# Patient Record
Sex: Female | Born: 1970 | Race: White | Hispanic: No | Marital: Married | Smoking: Never smoker
Health system: Southern US, Community
[De-identification: ages and names within clinical notes are randomized; demographics above are authoritative.]

## PROBLEM LIST (undated history)

## (undated) DIAGNOSIS — K219 Gastro-esophageal reflux disease without esophagitis: Secondary | ICD-10-CM

## (undated) DIAGNOSIS — R112 Nausea with vomiting, unspecified: Secondary | ICD-10-CM

## (undated) DIAGNOSIS — I10 Essential (primary) hypertension: Secondary | ICD-10-CM

## (undated) DIAGNOSIS — N63 Unspecified lump in unspecified breast: Secondary | ICD-10-CM

## (undated) DIAGNOSIS — Z9889 Other specified postprocedural states: Secondary | ICD-10-CM

## (undated) HISTORY — DX: Essential (primary) hypertension: I10

---

## 1994-04-13 HISTORY — PX: APPENDECTOMY: SHX54

## 1998-04-13 HISTORY — PX: CHOLECYSTECTOMY: SHX55

## 2008-10-24 ENCOUNTER — Ambulatory Visit (HOSPITAL_COMMUNITY): Admission: RE | Admit: 2008-10-24 | Discharge: 2008-10-24 | Payer: Self-pay | Admitting: Obstetrics and Gynecology

## 2008-11-21 ENCOUNTER — Encounter: Payer: Self-pay | Admitting: Obstetrics and Gynecology

## 2008-12-04 ENCOUNTER — Ambulatory Visit (HOSPITAL_COMMUNITY): Admission: RE | Admit: 2008-12-04 | Discharge: 2008-12-04 | Payer: Self-pay | Admitting: Obstetrics and Gynecology

## 2010-04-13 HISTORY — PX: PARTIAL HYSTERECTOMY: SHX80

## 2017-02-15 ENCOUNTER — Encounter: Payer: Self-pay | Admitting: Allergy and Immunology

## 2017-02-15 ENCOUNTER — Ambulatory Visit: Payer: BC Managed Care – PPO | Admitting: Allergy and Immunology

## 2017-02-15 VITALS — BP 130/80 | HR 80 | Temp 98.5°F | Resp 18 | Ht 65.7 in | Wt 193.4 lb

## 2017-02-15 DIAGNOSIS — H1010 Acute atopic conjunctivitis, unspecified eye: Secondary | ICD-10-CM

## 2017-02-15 DIAGNOSIS — J3089 Other allergic rhinitis: Secondary | ICD-10-CM | POA: Diagnosis not present

## 2017-02-15 DIAGNOSIS — T7840XA Allergy, unspecified, initial encounter: Secondary | ICD-10-CM

## 2017-02-15 DIAGNOSIS — T783XXA Angioneurotic edema, initial encounter: Secondary | ICD-10-CM | POA: Diagnosis not present

## 2017-02-15 MED ORDER — FAMOTIDINE 20 MG PO TABS: 20.0000 mg | ORAL_TABLET | Freq: Every day | ORAL | 5 refills | Status: AC

## 2017-02-15 MED ORDER — AUVI-Q 0.3 MG/0.3ML IJ SOAJ: INTRAMUSCULAR | 3 refills | Status: DC

## 2017-02-15 MED ORDER — MONTELUKAST SODIUM 10 MG PO TABS: ORAL_TABLET | ORAL | 5 refills | Status: DC

## 2017-02-15 NOTE — Patient Instructions (Addendum)
  1.  Allergen avoidance measures  2.  Preventative plan:   A.  Cetirizine 10 mg 1 time per day  B.  Famotidine 20 mg 1 time per day  C.  Montelukast 10 mg 1 time per day  3.  If needed: AUVI-Q 0.3, Benadryl, MD/ER evaluation  4.  Blood - alpha gal, thyroid peroxidase antibody  5.  Return to clinic in 4 weeks or earlier if problem

## 2017-02-15 NOTE — Progress Notes (Signed)
Dear Dr. Samuel Germany,  Thank you for referring Shari Maldonado to the [facility] [place] on 11/Maldonado/2018.   Below is a summation of this patient's evaluation and recommendations.  Thank you for your referral. I will keep you informed about this patient's response to treatment.   If you have any questions please do not hesitate to contact me.   Sincerely,  Jessica Priest, MD Allergy / Immunology [place] [facility]   ______________________________________________________________________    NEW PATIENT NOTE  Referring Provider: Guadalupe Maple., MD Primary Provider: Guadalupe Maple., MD Date of office visit: 11/Maldonado/2018    Subjective:   Chief Complaint:  Shari Maldonado, 1972) is a 46 y.o. female who presents to the clinic on 11/Maldonado/2018 with a chief complaint of Facial Swelling .     HPI: Shari Maldonado presents to this clinic in evaluation of 3 allergic reactions.  Shari Maldonado first reaction occurred on 05 January 2017 and presented as a red swollen right eye.  Within a week Shari Maldonado developed another reaction involving bilateral redness and swelling of Shari Maldonado eyes.  On 23 October Shari Maldonado had severe periorbital swelling and conjunctival swelling and edema and developed facial swelling including lip swelling.  Shari Maldonado was evaluated by Shari Maldonado primary care doctor and given a systemic steroid including a Kenalog and prednisone as well as antihistamines and Shari Maldonado reaction lasted somewhere between 1-2 days.  Shari Maldonado never had any associated systemic or constitutional symptoms with this issue.  Shari Maldonado notes no obvious provoking factor giving rise to this issue.  Shari Maldonado has not had a significant environmental change or is using new medications or supplements.  Shari Maldonado does have a history of seasonal allergic rhinitis mostly involving nasal congestion and sneezing for which Shari Maldonado will take an over-the-counter Claritin which works successfully.  This is a minimal issue  for Shari Maldonado and does not really require a significant amount of treatment.  Shari Maldonado has no other atopic disease.  Past Medical History:  Diagnosis Date  . High blood pressure     Past Surgical History:  Procedure Laterality Date  . APPENDECTOMY  1996  . CHOLECYSTECTOMY  2000  . PARTIAL HYSTERECTOMY  2012    Allergies as of 02/15/2017   No Known Allergies     Medication List      clonazePAM 1 MG tablet Commonly known as:  KLONOPIN Take 1 mg 2 (two) times daily by mouth.   metoprolol succinate 25 MG 24 hr tablet Commonly known as:  TOPROL-XL TAKE ONE TABLET BY MOUTH DAILY, MAY increase TO TWO tablets AS NEEDED   nitroGLYCERIN 0.4 MG SL tablet Commonly known as:  NITROSTAT Place under the tongue.         Review of systems negative except as noted in HPI / PMHx or noted below:  Review of Systems  Constitutional: Negative.   HENT: Negative.   Eyes: Negative.   Respiratory: Negative.   Cardiovascular: Negative.   Gastrointestinal: Negative.   Genitourinary: Negative.   Musculoskeletal: Negative.   Skin: Negative.   Neurological: Negative.   Endo/Heme/Allergies: Negative.   Psychiatric/Behavioral: Negative.     Family History  Problem Relation Age of Onset  . High blood pressure Mother   . Diabetes Mother   . High blood pressure Father   . Lung cancer Maternal Grandmother   . Heart disease Maternal Grandmother   . Congestive Heart Failure Maternal Grandmother   . COPD Maternal Grandmother   .  Asthma Maternal Grandmother   . Prostate cancer Maternal Grandfather   . Lung cancer Paternal Grandmother   . Lung cancer Paternal Grandfather   . Throat cancer Paternal Grandfather   . Asthma Son   . Asthma Daughter     Social History   Socioeconomic History  . Marital status: Married    Spouse name: Not on file  . Number of children: Not on file  . Years of education: Not on file  . Highest education level: Not on file  Social Needs  . Financial resource strain:  Not on file  . Food insecurity - worry: Not on file  . Food insecurity - inability: Not on file  . Transportation needs - medical: Not on file  . Transportation needs - non-medical: Not on file  Occupational History  . Not on file  Tobacco Use  . Smoking status: Never Smoker  . Smokeless tobacco: Never Used  Substance and Sexual Activity  . Alcohol use: Yes    Comment: occasional  . Drug use: No  . Sexual activity: Not on file  Other Topics Concern  . Not on file  Social History Narrative  . Not on file    Environmental and Social history  Lives in a house with a dry environment, a dog located inside the household, iCAD located outside the household, no carpet in the bedroom, plastic on the bed, no plastic on the pillow, and no smokers located inside the household.  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  Objective:   Vitals:   11/Maldonado/18 1006  BP: 130/80  Pulse: 80  Resp: 18  Temp: 98.5 F (36.9 C)   Height: 5' 5.7" (166.9 cm) Weight: 193 lb 6.4 oz (87.7 kg)  Physical Exam  Constitutional: Shari Maldonado is well-developed, well-nourished, and in no distress.  HENT:  Head: Normocephalic. Head is without right periorbital erythema and without left periorbital erythema.  Right Ear: Tympanic membrane, external ear and ear canal normal.  Left Ear: Tympanic membrane, external ear and ear canal normal.  Nose: Nose normal. No mucosal edema or rhinorrhea.  Mouth/Throat: Uvula is midline, oropharynx is clear and moist and mucous membranes are normal. No oropharyngeal exudate.  Eyes: Conjunctivae and lids are normal. Pupils are equal, round, and reactive to light.  Neck: Trachea normal. No tracheal tenderness present. No tracheal deviation present. No thyromegaly present.  Cardiovascular: Normal rate, regular rhythm, S1 normal, S2 normal and normal heart sounds.  No murmur heard. Pulmonary/Chest: Effort normal and breath sounds normal. No stridor. No tachypnea. No  respiratory distress. Shari Maldonado has no wheezes. Shari Maldonado has no rales. Shari Maldonado exhibits no tenderness.  Abdominal: Soft. Shari Maldonado exhibits no distension and no mass. There is no hepatosplenomegaly. There is no tenderness. There is no rebound and no guarding.  Musculoskeletal: Shari Maldonado exhibits no edema or tenderness.  Lymphadenopathy:       Head (right side): No tonsillar adenopathy present.       Head (left side): No tonsillar adenopathy present.    Shari Maldonado has no cervical adenopathy.    Shari Maldonado has no axillary adenopathy.  Neurological: Shari Maldonado is alert. Gait normal.  Skin: No rash noted. Shari Maldonado is not diaphoretic. No erythema. No pallor. Nails show no clubbing.  Psychiatric: Mood and affect normal.    Diagnostics: Allergy skin tests were performed.  Shari Maldonado demonstrated hypersensitivity to house dust mite.  Shari Maldonado did not demonstrate any hypersensitivity to a screening panel of foods.  Review of blood tests obtained at 08 January 2017  identified normal hepatic and renal function, normal TSH, WBC 6.5, absolute lymphocyte 1600, absolute eosinophil 200, hemoglobin 13.5, platelet 202.  Assessment and Plan:    1. Allergic reaction, initial encounter   2. Angioedema, initial encounter   3. Seasonal allergic conjunctivitis   4. Other allergic rhinitis     1.  Allergen avoidance measures  2.  Preventative plan:   A.  Cetirizine 10 mg 1 time per day  B.  Famotidine 20 mg 1 time per day  C.  Montelukast 10 mg 1 time per day  3.  If needed: AUVI-Q 0.3, Benadryl, MD/ER evaluation  4.  Blood - alpha gal, thyroid peroxidase antibody  5.  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  Certainly Shari Maldonado has some atopic disease but I am not entirely sure that Shari Maldonado reactions involving Shari Maldonado face or tied up with this atopic disease.  For now Shari Maldonado will utilize a combination blocker and a leukotriene modifier and I am going to obtain some blood tests and investigation of  immunological hyperreactivity as noted above.  I will regroup with Shari Maldonado in 4 weeks or earlier if there is a problem.  Jessica PriestEric J. Shreyas Piatkowski, MD Allergy / Immunology [facility] [place]

## 2017-02-16 ENCOUNTER — Encounter: Payer: Self-pay | Admitting: Allergy and Immunology

## 2017-02-19 LAB — ALPHA-GAL PANEL
Alpha Gal IgE*: <0.1 kU/L (ref <0.10)
Class Interpretation: 0
Class Interpretation: 0
Class Interpretation: 0
Lamb/Mutton (Ovis spp) IgE: <0.1 kU/L (ref <0.35)

## 2017-02-19 LAB — THYROID PEROXIDASE ANTIBODY: THYROID PEROXIDASE ANTIBODY: 11 [IU]/mL (ref 0–34)

## 2017-03-17 ENCOUNTER — Ambulatory Visit: Payer: BC Managed Care – PPO | Admitting: Allergy and Immunology

## 2021-01-22 ENCOUNTER — Other Ambulatory Visit: Payer: Self-pay | Admitting: Nurse Practitioner

## 2021-01-22 DIAGNOSIS — R928 Other abnormal and inconclusive findings on diagnostic imaging of breast: Secondary | ICD-10-CM

## 2021-02-07 ENCOUNTER — Other Ambulatory Visit: Payer: Self-pay

## 2021-02-07 ENCOUNTER — Ambulatory Visit
Admission: RE | Admit: 2021-02-07 | Discharge: 2021-02-07 | Disposition: A | Discharge status: Home / Self Care | Payer: Self-pay | Source: Ambulatory Visit | Attending: Nurse Practitioner | Admitting: Nurse Practitioner

## 2021-02-07 ENCOUNTER — Ambulatory Visit
Admission: RE | Admit: 2021-02-07 | Discharge: 2021-02-07 | Disposition: A | Discharge status: Home / Self Care | Payer: BC Managed Care – PPO | Source: Ambulatory Visit | Attending: Nurse Practitioner | Admitting: Nurse Practitioner

## 2021-02-07 DIAGNOSIS — R928 Other abnormal and inconclusive findings on diagnostic imaging of breast: Secondary | ICD-10-CM

## 2021-03-24 ENCOUNTER — Other Ambulatory Visit: Payer: Self-pay | Admitting: Surgery

## 2021-03-24 DIAGNOSIS — R2233 Localized swelling, mass and lump, upper limb, bilateral: Secondary | ICD-10-CM

## 2021-04-16 ENCOUNTER — Ambulatory Visit
Admission: RE | Admit: 2021-04-16 | Discharge: 2021-04-16 | Disposition: A | Discharge status: Home / Self Care | Payer: BC Managed Care – PPO | Source: Ambulatory Visit | Attending: Surgery | Admitting: Surgery

## 2021-04-16 DIAGNOSIS — R2233 Localized swelling, mass and lump, upper limb, bilateral: Secondary | ICD-10-CM

## 2021-04-16 MED ORDER — GADOBUTROL 1 MMOL/ML IV SOLN
8.0000 mL | Freq: Once | INTRAVENOUS | Status: AC | PRN
  Administered 2021-04-16: 8 mL via INTRAVENOUS

## 2021-04-18 ENCOUNTER — Other Ambulatory Visit: Payer: Self-pay | Admitting: Surgery

## 2021-04-18 DIAGNOSIS — N6312 Unspecified lump in the right breast, upper inner quadrant: Secondary | ICD-10-CM

## 2021-04-18 DIAGNOSIS — N6321 Unspecified lump in the left breast, upper outer quadrant: Secondary | ICD-10-CM

## 2021-04-18 NOTE — Progress Notes (Signed)
Please call the patient and let them know that their MRI showed no sign of axillary lymph nodes or masses, but she has questionable areas in each breast that should be biopsied.  Orders entered.

## 2021-04-22 ENCOUNTER — Other Ambulatory Visit: Payer: Self-pay | Admitting: Surgery

## 2021-04-22 DIAGNOSIS — N6321 Unspecified lump in the left breast, upper outer quadrant: Secondary | ICD-10-CM

## 2021-04-25 ENCOUNTER — Ambulatory Visit
Admission: RE | Admit: 2021-04-25 | Discharge: 2021-04-25 | Disposition: A | Discharge status: Home / Self Care | Payer: BC Managed Care – PPO | Source: Ambulatory Visit | Attending: Surgery | Admitting: Surgery

## 2021-04-25 ENCOUNTER — Other Ambulatory Visit: Payer: Self-pay | Admitting: Diagnostic Radiology

## 2021-04-25 ENCOUNTER — Other Ambulatory Visit: Payer: Self-pay

## 2021-04-25 DIAGNOSIS — N6321 Unspecified lump in the left breast, upper outer quadrant: Secondary | ICD-10-CM

## 2021-04-25 DIAGNOSIS — N6312 Unspecified lump in the right breast, upper inner quadrant: Secondary | ICD-10-CM

## 2021-04-25 MED ORDER — GADOBUTROL 1 MMOL/ML IV SOLN
8.0000 mL | Freq: Once | INTRAVENOUS | Status: AC | PRN
  Administered 2021-04-25: 8 mL via INTRAVENOUS

## 2021-05-01 LAB — HEPATITIS C ANTIBODY
Hepatitis C Ab: NONREACTIVE
SIGNAL TO CUT-OFF: <0.02 (ref <1.00)

## 2021-05-01 LAB — TEST AUTHORIZATION

## 2021-05-01 LAB — HIV ANTIBODY (ROUTINE TESTING W REFLEX): HIV 1&2 Ab, 4th Generation: NONREACTIVE

## 2021-05-01 LAB — HEPATITIS B SURFACE ANTIBODY, QUANTITATIVE: Hepatitis B-Post: 19 m[IU]/mL (ref >=10)

## 2021-05-01 LAB — HEPATITIS B SURFACE ANTIGEN: Hepatitis B Surface Ag: NONREACTIVE

## 2021-05-05 ENCOUNTER — Ambulatory Visit: Payer: Self-pay | Admitting: Surgery

## 2021-05-05 DIAGNOSIS — N6489 Other specified disorders of breast: Secondary | ICD-10-CM

## 2021-05-05 DIAGNOSIS — N6310 Unspecified lump in the right breast, unspecified quadrant: Secondary | ICD-10-CM

## 2021-05-05 NOTE — H&P (Signed)
Subjective    Chief Complaint: No chief complaint on file.       History of Present Illness: Shari Maldonado is a 51 y.o. female who is seen today for follow-up of her recent imaging studies and biopsies.   Shari Maldonado is a 51 y.o. female who was seen on 03/24/21 as an office consultation at the request of NP Potts for evaluation of New Consultation (L breast cyst) .   This is a 51 year old female in otherwise good health who presents with recent complaints of swelling and tenderness in the lateral left breast near the axilla.  Intermittently she gets the same swelling in the right axilla.  She recently saw nurse practitioner Leroy Sea for establishment as a new patient.  She underwent mammogram and ultrasound that showed only some small cysts within the lateral left breast at 3:00.  However this does not correspond to her area of swelling and tenderness.   She states that intermittently she develops swelling in both axilla that are large enough for her to see.  She feels these areas and feel firm masses.   Upon further questioning, the patient reports that in August of this year, she had approximately 8 weeks of chronic coughing with chest tightness.  She was treated with 3 different courses of antibiotics with minimal improvement.  She still has not fully recovered from that.   Also upon further questioning, the patient reports easy bruising with scattered ecchymosis of her legs.  She also reports frequently waking up with night sweats.  She reports weight loss with no effort.  Previously she was on a weight loss medication but she has not been on it for some time and continues to lose weight.   The patient reports no family history of breast cancer.  However her grandfather had colon cancer.   We began her evaluation with breast MRI.  The MRI showed no sign of pathologic lymphadenopathy.  She did have indeterminate areas of non-mass enhancement in the left upper outer quadrant and the right  upper inner quadrant.  She underwent MRI guided biopsy on 04/25/2021.  The left breast showed a complex sclerosing lesion, fibrocystic changes, and focal fibroadenomatoid proliferation with no sign of malignancy.  The right breast mass showed only sclerosing adenosis.  She returns today to discuss pathology report as well as to determine future plans.   The patient remains very concerned about both of these breasts masses even though with the pathology did not show cancer.  She tells me that there was some complications during the right breast biopsy in which the radiologist punctured himself with the biopsy needle.  She is concerned that they may have missed the mass with the biopsy.  She has no family history of breast cancer and has no strong indications for genetic testing.   She asked about the possibility of bilateral mastectomies on a prophylactic basis.     Medical History: Past Medical History      Past Medical History:  Diagnosis Date   Hypertension             Patient Active Problem List  Diagnosis   Essential hypertension   Gastroesophageal reflux disease without esophagitis   Complex sclerosing lesion of left breast      Past Surgical History       Past Surgical History:  Procedure Laterality Date   APPENDECTOMY       CHOLECYSTECTOMY       REPAIR HIATAL HERNIA  Allergies  No Known Allergies     No current outpatient medications on file prior to visit.    No current facility-administered medications on file prior to visit.      Family History       Family History  Problem Relation Age of Onset   Obesity Mother     High blood pressure (Hypertension) Mother     Diabetes Mother     Obesity Sister          Social History       Tobacco Use  Smoking Status Never  Smokeless Tobacco Never      Social History  Social History        Socioeconomic History   Marital status: Married  Tobacco Use   Smoking status: Never   Smokeless tobacco:  Never  Vaping Use   Vaping Use: Never used  Substance and Sexual Activity   Alcohol use: Not Currently   Drug use: Never        Objective:      There were no vitals filed for this visit.  There is no height or weight on file to calculate BMI.   Physical Exam    Constitutional:  WDWN in NAD, conversant, no obvious deformities; lying in bed comfortably Eyes:  Pupils equal, round; sclera anicteric; moist conjunctiva; no lid lag HENT:  Oral mucosa moist; good dentition  Neck:  No masses palpated, trachea midline; no thyromegaly Lungs:  CTA bilaterally; normal respiratory effort Breasts:  symmetric, no nipple changes; bilateral fibrocystic changes with no dominant masses; no obvious lymphadenopathy in either axilla  CV:  Regular rate and rhythm; no murmurs; extremities well-perfused with no edema Abd:  +bowel sounds, soft, non-tender, no palpable organomegaly; no palpable hernias Musc:  Unable to assess gait; no apparent clubbing or cyanosis in extremities Lymphatic:  No palpable cervical or axillary lymphadenopathy Skin:  Warm, dry; no sign of jaundice Psychiatric - alert and oriented x 4; calm mood and affect     Labs, Imaging and Diagnostic Testing: Diagnosis 1. Breast, left, needle core biopsy, upper outer quadrant, barbell shaped clip - COMPLEX SCLEROSING LESION, FIBROCYSTIC CHANGES AND FOCAL FIBROADENOMATOID PROLIFERATION. NO MALIGNANCY. 2. Breast, right, needle core biopsy, upper inner quadrant, cylinder shaped clip - SCLEROSING ADENOSIS. NO MALIGNANCY. Mark Martinique MD Pathologist, Electronic Signature (Case signed 04/28/2021)   CLINICAL DATA:  51 year old with possible BILATERAL axillary masses with intermittent pain and swelling for 1 month. The patient had screening detected asymmetries in the LEFT breast in October, 2022, shown on diagnostic workup to represent overlapping normal fibroglandular tissue. Benign LEFT breast cysts were identified on ultrasound at that  time.   EXAM: BILATERAL BREAST MRI WITH AND WITHOUT CONTRAST   TECHNIQUE: Multiplanar, multisequence MR images of both breasts were obtained prior to and following the intravenous administration of 8 ml of Gadavist.   Three-dimensional MR images were rendered by post-processing of the original MR data on an independent workstation. The three-dimensional MR images were interpreted, and findings are reported in the following complete MRI report for this study. Three dimensional images were evaluated at the independent interpreting workstation using the DynaCAD thin client.   COMPARISON:  No prior MRI. Multiple prior mammograms and LEFT breast ultrasound, most recently 02/07/2021.   FINDINGS: Breast composition: c. Heterogeneous fibroglandular tissue.   Background parenchymal enhancement: Moderate.   RIGHT breast: Focal non-mass enhancement in the UPPER INNER QUADRANT at middle to posterior depth, measuring 1.3 x 0.9 x 0.9 cm (AP x transverse x  craniocaudal), demonstrating progressive kinetics, in an area of focally dense fibroglandular tissue on mammography.   No suspicious mass or abnormal enhancement elsewhere.   LEFT breast: Focal non-mass enhancement in the UPPER OUTER QUADRANT at posterior depth, measuring 1.4 x 0.6 x 0.8 cm, demonstrating progressive kinetics, in an area of focally dense fibroglandular tissue on mammography.   No suspicious mass or abnormal enhancement elsewhere.   Lymph nodes: No pathologic lymphadenopathy. Specifically, no evidence of axillary mass or lymphadenopathy on either side.   Ancillary findings: Scarring in the anterior RIGHT MIDDLE LOBE. No significant ancillary findings.   IMPRESSION: 1. Indeterminate foci of non-mass enhancement in the UPPER OUTER QUADRANT of the LEFT breast and in the [place] of the RIGHT breast. 2. No pathologic lymphadenopathy. Specifically, no mass or lymphadenopathy involving either axilla.    RECOMMENDATION: MRI guided biopsies of the indeterminate non-mass enhancement in both breasts.   BI-RADS CATEGORY  4: Suspicious.     Electronically Signed   By: Evangeline Dakin M.D.   On: 04/18/2021 08:47     Assessment and Plan:  Diagnoses and all orders for this visit:   Complex sclerosing lesion of left breast    Sclerosing adenosis/ breast mass right breast   I spent a considerable amount of time with the patient discussing both of these pathology findings as well as her current situation.  Neither 1 of these biopsies represent cancer.  Statistically, there is a slight increase in her risk of breast cancer with the complex sclerosing lesion.  I understand her concern about the biopsy on the right side.  I did offer bilateral lumpectomies using radioactive seed localization.   We discussed her question of bilateral prophylactic mastectomies.  At this time, she has no indication for mastectomies so this would be purely elective and likely not covered by insurance.  We discussed the procedures and the expected comorbidities and downtime related to such extensive surgery.  We discussed the possibility of reconstruction.  I answered all of her questions.   At this time, she is opting for bilateral radioactive seed localized lumpectomies.  Once she knows that both areas are definitely benign, she may consider a plastic surgery referral for bilateral breast reductions.   Bilateral breast radioactive seed localized lumpectomies.  The surgical procedure has been discussed with the patient.  Potential risks, benefits, alternative treatments, and expected outcomes have been explained.  All of the patient's questions at this time have been answered.  The likelihood of reaching the patient's treatment goal is good.  The patient understand the proposed surgical procedure and wishes to proceed.     No follow-ups on file.   Charlette Hennings Jearld Adjutant, MD    05/05/2021 9:48 AM

## 2021-05-05 NOTE — H&P (View-Only) (Signed)
°Subjective  °  °Chief Complaint: No chief complaint on file. °  °  °  °History of Present Illness: °Shari Maldonado is a 51 y.o. female who is seen today for follow-up of her recent imaging studies and biopsies. °  °Shari Maldonado is a 51 y.o. female who was seen on 03/24/21 as an office consultation at the request of NP Potts for evaluation of New Consultation (L breast cyst) °.   °This is a 51-year-old female in otherwise good health who presents with recent complaints of swelling and tenderness in the lateral left breast near the axilla.  Intermittently she gets the same swelling in the right axilla.  She recently saw nurse practitioner Potts for establishment as a new patient.  She underwent mammogram and ultrasound that showed only some small cysts within the lateral left breast at 3:00.  However this does not correspond to her area of swelling and tenderness. °  °She states that intermittently she develops swelling in both axilla that are large enough for her to see.  She feels these areas and feel firm masses. °  °Upon further questioning, the patient reports that in August of this year, she had approximately 8 weeks of chronic coughing with chest tightness.  She was treated with 3 different courses of antibiotics with minimal improvement.  She still has not fully recovered from that. °  °Also upon further questioning, the patient reports easy bruising with scattered ecchymosis of her legs.  She also reports frequently waking up with night sweats.  She reports weight loss with no effort.  Previously she was on a weight loss medication but she has not been on it for some time and continues to lose weight. °  °The patient reports no family history of breast cancer.  However her grandfather had colon cancer. °  °We began her evaluation with breast MRI.  The MRI showed no sign of pathologic lymphadenopathy.  She did have indeterminate areas of non-mass enhancement in the left upper outer quadrant and the right  upper inner quadrant.  She underwent MRI guided biopsy on 04/25/2021.  The left breast showed a complex sclerosing lesion, fibrocystic changes, and focal fibroadenomatoid proliferation with no sign of malignancy.  The right breast mass showed only sclerosing adenosis.  She returns today to discuss pathology report as well as to determine future plans. °  °The patient remains very concerned about both of these breasts masses even though with the pathology did not show cancer.  She tells me that there was some complications during the right breast biopsy in which the radiologist punctured himself with the biopsy needle.  She is concerned that they may have missed the mass with the biopsy.  She has no family history of breast cancer and has no strong indications for genetic testing. °  °She asked about the possibility of bilateral mastectomies on a prophylactic basis. °  °  °Medical History: °Past Medical History  °    °Past Medical History:  °Diagnosis Date  ° Hypertension    °  °  °  °   °Patient Active Problem List  °Diagnosis  ° Essential hypertension  ° Gastroesophageal reflux disease without esophagitis  ° Complex sclerosing lesion of left breast  °  °  °Past Surgical History  °     °Past Surgical History:  °Procedure Laterality Date  ° APPENDECTOMY      ° CHOLECYSTECTOMY      ° REPAIR HIATAL HERNIA      °  °  °  °  Allergies  °No Known Allergies  ° °  °No current outpatient medications on file prior to visit.  °  °No current facility-administered medications on file prior to visit.  °  °  °Family History  °     °Family History  °Problem Relation Age of Onset  ° Obesity Mother    ° High blood pressure (Hypertension) Mother    ° Diabetes Mother    ° Obesity Sister    °  °  °  °Social History  °  °   °Tobacco Use  °Smoking Status Never  °Smokeless Tobacco Never  °  °  °Social History  °Social History  °  °    °Socioeconomic History  ° Marital status: Married  °Tobacco Use  ° Smoking status: Never  ° Smokeless tobacco:  Never  °Vaping Use  ° Vaping Use: Never used  °Substance and Sexual Activity  ° Alcohol use: Not Currently  ° Drug use: Never  °  °  °  °Objective:  °  °  °There were no vitals filed for this visit.  °There is no height or weight on file to calculate BMI. °  °Physical Exam  °  °Constitutional:  WDWN in NAD, conversant, no obvious deformities; lying in bed comfortably °Eyes:  Pupils equal, round; sclera anicteric; moist conjunctiva; no lid lag °HENT:  Oral mucosa moist; good dentition  °Neck:  No masses palpated, trachea midline; no thyromegaly °Lungs:  CTA bilaterally; normal respiratory effort °Breasts:  symmetric, no nipple changes; bilateral fibrocystic changes with no dominant masses; no obvious lymphadenopathy in either axilla  °CV:  Regular rate and rhythm; no murmurs; extremities well-perfused with no edema °Abd:  +bowel sounds, soft, non-tender, no palpable organomegaly; no palpable hernias °Musc:  Unable to assess gait; no apparent clubbing or cyanosis in extremities °Lymphatic:  No palpable cervical or axillary lymphadenopathy °Skin:  Warm, dry; no sign of jaundice °Psychiatric - alert and oriented x 4; calm mood and affect °  °  °Labs, Imaging and Diagnostic Testing: °Diagnosis °1. Breast, left, needle core biopsy, upper outer quadrant, barbell shaped clip °- COMPLEX SCLEROSING LESION, FIBROCYSTIC CHANGES AND FOCAL FIBROADENOMATOID PROLIFERATION. NO °MALIGNANCY. °2. Breast, right, needle core biopsy, upper inner quadrant, cylinder shaped clip °- SCLEROSING ADENOSIS. NO MALIGNANCY. °Mark Jordan MD °Pathologist, Electronic Signature °(Case signed 04/28/2021) °  °CLINICAL DATA:  50-year-old with possible BILATERAL axillary masses °with intermittent pain and swelling for 1 month. The patient had °screening detected asymmetries in the LEFT breast in October, 2022, °shown on diagnostic workup to represent overlapping normal °fibroglandular tissue. Benign LEFT breast cysts were identified on °ultrasound at that  time. °  °EXAM: °BILATERAL BREAST MRI WITH AND WITHOUT CONTRAST °  °TECHNIQUE: °Multiplanar, multisequence MR images of both breasts were obtained °prior to and following the intravenous administration of 8 ml of °Gadavist. °  °Three-dimensional MR images were rendered by post-processing of the °original MR data on an independent workstation. The °three-dimensional MR images were interpreted, and findings are °reported in the following complete MRI report for this study. Three °dimensional images were evaluated at the independent interpreting °workstation using the DynaCAD thin client. °  °COMPARISON:  No prior MRI. Multiple prior mammograms and LEFT breast °ultrasound, most recently 02/07/2021. °  °FINDINGS: °Breast composition: c. Heterogeneous fibroglandular tissue. °  °Background parenchymal enhancement: Moderate. °  °RIGHT breast: Focal non-mass enhancement in the UPPER INNER QUADRANT °at middle to posterior depth, measuring 1.3 x 0.9 x 0.9 cm (AP x °transverse x   craniocaudal), demonstrating progressive kinetics, in °an area of focally dense fibroglandular tissue on mammography. °  °No suspicious mass or abnormal enhancement elsewhere. °  °LEFT breast: Focal non-mass enhancement in the UPPER OUTER QUADRANT °at posterior depth, measuring 1.4 x 0.6 x 0.8 cm, demonstrating °progressive kinetics, in an area of focally dense fibroglandular °tissue on mammography. °  °No suspicious mass or abnormal enhancement elsewhere. °  °Lymph nodes: No pathologic lymphadenopathy. Specifically, no °evidence of axillary mass or lymphadenopathy on either side. °  °Ancillary findings: Scarring in the anterior RIGHT MIDDLE LOBE. No °significant ancillary findings. °  °IMPRESSION: °1. Indeterminate foci of non-mass enhancement in the UPPER OUTER °QUADRANT of the LEFT breast and in the UPPER INNER QUADRANT of the °RIGHT breast. °2. No pathologic lymphadenopathy. Specifically, no mass or °lymphadenopathy involving either axilla. °   °RECOMMENDATION: °MRI guided biopsies of the indeterminate non-mass enhancement in °both breasts. °  °BI-RADS CATEGORY  4: Suspicious. °  °  °Electronically Signed °  By: Thomas  Lawrence M.D. °  On: 04/18/2021 08:47 °  °  °Assessment and Plan:  °Diagnoses and all orders for this visit: °  °Complex sclerosing lesion of left breast °  ° Sclerosing adenosis/ breast mass right breast °  °I spent a considerable amount of time with the patient discussing both of these pathology findings as well as her current situation.  Neither 1 of these biopsies represent cancer.  Statistically, there is a slight increase in her risk of breast cancer with the complex sclerosing lesion.  I understand her concern about the biopsy on the right side.  I did offer bilateral lumpectomies using radioactive seed localization. °  °We discussed her question of bilateral prophylactic mastectomies.  At this time, she has no indication for mastectomies so this would be purely elective and likely not covered by insurance.  We discussed the procedures and the expected comorbidities and downtime related to such extensive surgery.  We discussed the possibility of reconstruction.  I answered all of her questions. °  °At this time, she is opting for bilateral radioactive seed localized lumpectomies.  Once she knows that both areas are definitely benign, she may consider a plastic surgery referral for bilateral breast reductions. °  °Bilateral breast radioactive seed localized lumpectomies.  The surgical procedure has been discussed with the patient.  Potential risks, benefits, alternative treatments, and expected outcomes have been explained.  All of the patient's questions at this time have been answered.  The likelihood of reaching the patient's treatment goal is good.  The patient understand the proposed surgical procedure and wishes to proceed. °  °  °No follow-ups on file. °  °Sharice Harriss KAI Berthe Oley, MD  °  °05/05/2021 °9:48 AM °

## 2021-05-08 ENCOUNTER — Other Ambulatory Visit: Payer: Self-pay | Admitting: Surgery

## 2021-05-08 DIAGNOSIS — N6310 Unspecified lump in the right breast, unspecified quadrant: Secondary | ICD-10-CM

## 2021-05-15 ENCOUNTER — Other Ambulatory Visit: Payer: Self-pay

## 2021-05-15 ENCOUNTER — Encounter (HOSPITAL_BASED_OUTPATIENT_CLINIC_OR_DEPARTMENT_OTHER): Payer: Self-pay | Admitting: Surgery

## 2021-05-21 ENCOUNTER — Encounter (HOSPITAL_BASED_OUTPATIENT_CLINIC_OR_DEPARTMENT_OTHER)
Admission: RE | Admit: 2021-05-21 | Discharge: 2021-05-21 | Disposition: A | Discharge status: Home / Self Care | Payer: BC Managed Care – PPO | Source: Ambulatory Visit | Attending: Surgery | Admitting: Surgery

## 2021-05-21 ENCOUNTER — Other Ambulatory Visit: Payer: Self-pay

## 2021-05-21 DIAGNOSIS — I10 Essential (primary) hypertension: Secondary | ICD-10-CM | POA: Diagnosis not present

## 2021-05-21 DIAGNOSIS — Z01812 Encounter for preprocedural laboratory examination: Secondary | ICD-10-CM | POA: Diagnosis not present

## 2021-05-21 LAB — BASIC METABOLIC PANEL
Anion gap: 8 (ref 5–15)
BUN: 14 mg/dL (ref 6–20)
CO2: 25 mmol/L (ref 22–32)
Calcium: 8.7 mg/dL — ABNORMAL LOW (ref 8.9–10.3)
Chloride: 105 mmol/L (ref 98–111)
Creatinine, Ser: 0.85 mg/dL (ref 0.44–1.00)
GFR, Estimated: >60 mL/min (ref >60)
Glucose, Bld: 98 mg/dL (ref 70–99)
Potassium: 3.6 mmol/L (ref 3.5–5.1)
Sodium: 138 mmol/L (ref 135–145)

## 2021-05-21 MED ORDER — CHLORHEXIDINE GLUCONATE CLOTH 2 % EX PADS: 6.0000 | MEDICATED_PAD | Freq: Once | CUTANEOUS | Status: DC

## 2021-05-21 NOTE — Progress Notes (Signed)
° ° ° ° °  Enhanced Recovery after Surgery for Orthopedics Enhanced Recovery after Surgery is a protocol used to improve the stress on your body and your recovery after surgery.  Patient Instructions  The night before surgery:  No food after midnight. ONLY clear liquids after midnight  The day of surgery (if you do NOT have diabetes):  Drink ONE (1) Pre-Surgery Clear Ensure as directed.   This drink was given to you during your hospital  pre-op appointment visit. The pre-op nurse will instruct you on the time to drink the  Pre-Surgery Ensure depending on your surgery time. Finish the drink at the designated time by the pre-op nurse.  Nothing else to drink after completing the  Pre-Surgery Clear Ensure.  The day of surgery (if you have diabetes): Drink ONE (1) Gatorade 2 (G2) as directed. This drink was given to you during your hospital  pre-op appointment visit.  The pre-op nurse will instruct you on the time to drink the   Gatorade 2 (G2) depending on your surgery time. Color of the Gatorade may vary. Red is not allowed. Nothing else to drink after completing the  Gatorade 2 (G2).         If you have questions, please contact your surgeons office.   Patient given drink, soap, and instructions. Patient verbalized understanding.

## 2021-05-26 ENCOUNTER — Ambulatory Visit
Admission: RE | Admit: 2021-05-26 | Discharge: 2021-05-26 | Disposition: A | Discharge status: Home / Self Care | Payer: BC Managed Care – PPO | Source: Ambulatory Visit | Attending: Surgery | Admitting: Surgery

## 2021-05-26 DIAGNOSIS — N6489 Other specified disorders of breast: Secondary | ICD-10-CM

## 2021-05-26 NOTE — Anesthesia Preprocedure Evaluation (Addendum)
Anesthesia Evaluation  Patient identified by MRN, date of birth, ID band Patient awake    Reviewed: Allergy & Precautions, H&P , NPO status , Patient's Chart, lab work & pertinent test results  History of Anesthesia Complications (+) PONV and history of anesthetic complications  Airway Mallampati: I  TM Distance: >3 FB Neck ROM: Full    Dental no notable dental hx. (+) Teeth Intact, Dental Advisory Given   Pulmonary neg pulmonary ROS,    Pulmonary exam normal breath sounds clear to auscultation       Cardiovascular Exercise Tolerance: Good hypertension, Pt. on home beta blockers Normal cardiovascular exam Rhythm:Regular Rate:Normal     Neuro/Psych negative neurological ROS  negative psych ROS   GI/Hepatic Neg liver ROS, GERD  ,  Endo/Other  negative endocrine ROS  Renal/GU negative Renal ROS  negative genitourinary   Musculoskeletal negative musculoskeletal ROS (+)   Abdominal   Peds negative pediatric ROS (+)  Hematology negative hematology ROS (+)   Anesthesia Other Findings   Reproductive/Obstetrics negative OB ROS                            Anesthesia Physical Anesthesia Plan  ASA: 2  Anesthesia Plan: General   Post-op Pain Management:    Induction: Intravenous  PONV Risk Score and Plan: 3 and Ondansetron, Dexamethasone and Midazolam  Airway Management Planned: LMA and Oral ETT  Additional Equipment: None  Intra-op Plan:   Post-operative Plan:   Informed Consent: I have reviewed the patients History and Physical, chart, labs and discussed the procedure including the risks, benefits and alternatives for the proposed anesthesia with the patient or authorized representative who has indicated his/her understanding and acceptance.       Plan Discussed with: CRNA, Surgeon and Anesthesiologist  Anesthesia Plan Comments: ( )       Anesthesia Quick Evaluation

## 2021-05-27 ENCOUNTER — Ambulatory Visit (HOSPITAL_BASED_OUTPATIENT_CLINIC_OR_DEPARTMENT_OTHER): Payer: BC Managed Care – PPO | Admitting: Anesthesiology

## 2021-05-27 ENCOUNTER — Ambulatory Visit (HOSPITAL_BASED_OUTPATIENT_CLINIC_OR_DEPARTMENT_OTHER)
Admission: RE | Admit: 2021-05-27 | Discharge: 2021-05-27 | Disposition: A | Discharge status: Home / Self Care | Payer: BC Managed Care – PPO | Attending: Surgery | Admitting: Surgery

## 2021-05-27 ENCOUNTER — Encounter (HOSPITAL_BASED_OUTPATIENT_CLINIC_OR_DEPARTMENT_OTHER): Payer: Self-pay | Admitting: Surgery

## 2021-05-27 ENCOUNTER — Other Ambulatory Visit: Payer: Self-pay

## 2021-05-27 ENCOUNTER — Ambulatory Visit
Admission: RE | Admit: 2021-05-27 | Discharge: 2021-05-27 | Disposition: A | Discharge status: Home / Self Care | Payer: BC Managed Care – PPO | Source: Ambulatory Visit | Attending: Surgery | Admitting: Surgery

## 2021-05-27 ENCOUNTER — Encounter (HOSPITAL_BASED_OUTPATIENT_CLINIC_OR_DEPARTMENT_OTHER)
Admission: RE | Disposition: A | Discharge status: Home / Self Care | Payer: Self-pay | Source: Home / Self Care | Attending: Surgery

## 2021-05-27 DIAGNOSIS — I10 Essential (primary) hypertension: Secondary | ICD-10-CM

## 2021-05-27 DIAGNOSIS — N6310 Unspecified lump in the right breast, unspecified quadrant: Secondary | ICD-10-CM

## 2021-05-27 DIAGNOSIS — N6022 Fibroadenosis of left breast: Secondary | ICD-10-CM | POA: Insufficient documentation

## 2021-05-27 DIAGNOSIS — N6021 Fibroadenosis of right breast: Secondary | ICD-10-CM | POA: Insufficient documentation

## 2021-05-27 DIAGNOSIS — N6082 Other benign mammary dysplasias of left breast: Secondary | ICD-10-CM | POA: Diagnosis not present

## 2021-05-27 DIAGNOSIS — Z800 Family history of malignant neoplasm of digestive organs: Secondary | ICD-10-CM | POA: Diagnosis not present

## 2021-05-27 HISTORY — DX: Unspecified lump in unspecified breast: N63.0

## 2021-05-27 HISTORY — DX: Nausea with vomiting, unspecified: R11.2

## 2021-05-27 HISTORY — DX: Other specified postprocedural states: Z98.890

## 2021-05-27 HISTORY — DX: Gastro-esophageal reflux disease without esophagitis: K21.9

## 2021-05-27 HISTORY — PX: BREAST EXCISIONAL BIOPSY: SUR124

## 2021-05-27 HISTORY — PX: BREAST LUMPECTOMY WITH RADIOACTIVE SEED LOCALIZATION: SHX6424

## 2021-05-27 SURGERY — BREAST LUMPECTOMY WITH RADIOACTIVE SEED LOCALIZATION
Anesthesia: General | Site: Breast | Laterality: Bilateral

## 2021-05-27 MED ORDER — FENTANYL CITRATE (PF) 100 MCG/2ML IJ SOLN
25.0000 ug | INTRAMUSCULAR | Status: DC | PRN
  Administered 2021-05-27: 50 ug via INTRAVENOUS

## 2021-05-27 MED ORDER — FENTANYL CITRATE (PF) 100 MCG/2ML IJ SOLN
INTRAMUSCULAR | Status: AC
  Filled 2021-05-27: qty 2

## 2021-05-27 MED ORDER — BUPIVACAINE-EPINEPHRINE (PF) 0.25% -1:200000 IJ SOLN
INTRAMUSCULAR | Status: AC
  Filled 2021-05-27: qty 60

## 2021-05-27 MED ORDER — CEFAZOLIN SODIUM-DEXTROSE 2-4 GM/100ML-% IV SOLN
INTRAVENOUS | Status: AC
  Filled 2021-05-27: qty 100

## 2021-05-27 MED ORDER — OXYCODONE HCL 5 MG/5ML PO SOLN: 5.0000 mg | Freq: Once | ORAL | Status: AC | PRN

## 2021-05-27 MED ORDER — AMISULPRIDE (ANTIEMETIC) 5 MG/2ML IV SOLN
INTRAVENOUS | Status: AC
  Filled 2021-05-27: qty 4

## 2021-05-27 MED ORDER — OXYCODONE HCL 5 MG PO TABS
ORAL_TABLET | ORAL | Status: AC
  Filled 2021-05-27: qty 1

## 2021-05-27 MED ORDER — AMISULPRIDE (ANTIEMETIC) 5 MG/2ML IV SOLN
10.0000 mg | Freq: Once | INTRAVENOUS | Status: AC
  Administered 2021-05-27: 10 mg via INTRAVENOUS

## 2021-05-27 MED ORDER — PROPOFOL 500 MG/50ML IV EMUL
INTRAVENOUS | Status: AC
  Filled 2021-05-27: qty 50

## 2021-05-27 MED ORDER — DEXAMETHASONE SODIUM PHOSPHATE 10 MG/ML IJ SOLN
INTRAMUSCULAR | Status: AC
  Filled 2021-05-27: qty 1

## 2021-05-27 MED ORDER — ONDANSETRON HCL 4 MG/2ML IJ SOLN: 4.0000 mg | Freq: Once | INTRAMUSCULAR | Status: DC | PRN

## 2021-05-27 MED ORDER — FERRIC SUBSULFATE 259 MG/GM EX SOLN
CUTANEOUS | Status: AC
  Filled 2021-05-27: qty 8

## 2021-05-27 MED ORDER — ACETAMINOPHEN 500 MG PO TABS
1000.0000 mg | ORAL_TABLET | ORAL | Status: AC
  Administered 2021-05-27: 1000 mg via ORAL

## 2021-05-27 MED ORDER — ONDANSETRON HCL 4 MG/2ML IJ SOLN
INTRAMUSCULAR | Status: DC | PRN
Start: 2021-05-27 — End: 2021-05-27
  Administered 2021-05-27: 4 mg via INTRAVENOUS

## 2021-05-27 MED ORDER — MEPERIDINE HCL 25 MG/ML IJ SOLN: 6.2500 mg | INTRAMUSCULAR | Status: DC | PRN

## 2021-05-27 MED ORDER — ACETAMINOPHEN 160 MG/5ML PO SOLN: 325.0000 mg | ORAL | Status: DC | PRN

## 2021-05-27 MED ORDER — ONDANSETRON HCL 4 MG/2ML IJ SOLN
INTRAMUSCULAR | Status: AC
  Filled 2021-05-27: qty 2

## 2021-05-27 MED ORDER — ACETAMINOPHEN 325 MG PO TABS: 325.0000 mg | ORAL_TABLET | ORAL | Status: DC | PRN

## 2021-05-27 MED ORDER — OXYCODONE HCL 5 MG PO TABS
5.0000 mg | ORAL_TABLET | Freq: Once | ORAL | Status: AC | PRN
  Administered 2021-05-27: 5 mg via ORAL

## 2021-05-27 MED ORDER — MIDAZOLAM HCL 5 MG/5ML IJ SOLN
INTRAMUSCULAR | Status: DC | PRN
  Administered 2021-05-27: 2 mg via INTRAVENOUS

## 2021-05-27 MED ORDER — LACTATED RINGERS IV SOLN: INTRAVENOUS | Status: DC

## 2021-05-27 MED ORDER — MIDAZOLAM HCL 2 MG/2ML IJ SOLN
INTRAMUSCULAR | Status: AC
  Filled 2021-05-27: qty 2

## 2021-05-27 MED ORDER — FENTANYL CITRATE (PF) 100 MCG/2ML IJ SOLN
INTRAMUSCULAR | Status: DC | PRN
  Administered 2021-05-27: 5 ug via INTRAVENOUS
  Administered 2021-05-27: 50 ug via INTRAVENOUS

## 2021-05-27 MED ORDER — DEXAMETHASONE SODIUM PHOSPHATE 4 MG/ML IJ SOLN
INTRAMUSCULAR | Status: DC | PRN
  Administered 2021-05-27: 10 mg via INTRAVENOUS

## 2021-05-27 MED ORDER — LIDOCAINE 2% (20 MG/ML) 5 ML SYRINGE
INTRAMUSCULAR | Status: AC
  Filled 2021-05-27: qty 5

## 2021-05-27 MED ORDER — ACETAMINOPHEN 500 MG PO TABS
ORAL_TABLET | ORAL | Status: AC
  Filled 2021-05-27: qty 1

## 2021-05-27 MED ORDER — SCOPOLAMINE 1 MG/3DAYS TD PT72
MEDICATED_PATCH | TRANSDERMAL | Status: AC
  Filled 2021-05-27: qty 1

## 2021-05-27 MED ORDER — EPHEDRINE SULFATE (PRESSORS) 50 MG/ML IJ SOLN
INTRAMUSCULAR | Status: DC | PRN
Start: 2021-05-27 — End: 2021-05-27
  Administered 2021-05-27: 10 mg via INTRAVENOUS

## 2021-05-27 MED ORDER — ACETAMINOPHEN 500 MG PO TABS
ORAL_TABLET | ORAL | Status: AC
  Filled 2021-05-27: qty 2

## 2021-05-27 MED ORDER — CEFAZOLIN SODIUM-DEXTROSE 2-4 GM/100ML-% IV SOLN
2.0000 g | INTRAVENOUS | Status: AC
  Administered 2021-05-27: 2 g via INTRAVENOUS

## 2021-05-27 MED ORDER — BUPIVACAINE-EPINEPHRINE 0.25% -1:200000 IJ SOLN
INTRAMUSCULAR | Status: DC | PRN
  Administered 2021-05-27: 20 mL

## 2021-05-27 MED ORDER — LIDOCAINE 2% (20 MG/ML) 5 ML SYRINGE
INTRAMUSCULAR | Status: DC | PRN
  Administered 2021-05-27: 60 mg via INTRAVENOUS

## 2021-05-27 MED ORDER — PROPOFOL 10 MG/ML IV BOLUS
INTRAVENOUS | Status: DC | PRN
  Administered 2021-05-27: 180 mg via INTRAVENOUS
  Administered 2021-05-27: 20 mg via INTRAVENOUS

## 2021-05-27 MED ORDER — BUPIVACAINE HCL (PF) 0.25 % IJ SOLN
INTRAMUSCULAR | Status: AC
  Filled 2021-05-27: qty 60

## 2021-05-27 SURGICAL SUPPLY — 42 items
APL PRP STRL LF DISP 70% ISPRP (MISCELLANEOUS) ×1
APL SKNCLS STERI-STRIP NONHPOA (GAUZE/BANDAGES/DRESSINGS) ×1
APPLIER CLIP 9.375 MED OPEN (MISCELLANEOUS)
APR CLP MED 9.3 20 MLT OPN (MISCELLANEOUS)
BENZOIN TINCTURE PRP APPL 2/3 (GAUZE/BANDAGES/DRESSINGS) ×2 IMPLANT
BLADE HEX COATED 2.75 (ELECTRODE) ×2 IMPLANT
BLADE SURG 15 STRL LF DISP TIS (BLADE) ×1 IMPLANT
BLADE SURG 15 STRL SS (BLADE) ×2
CANISTER SUC SOCK COL 7IN (MISCELLANEOUS) IMPLANT
CANISTER SUCT 1200ML W/VALVE (MISCELLANEOUS) IMPLANT
CHLORAPREP W/TINT 26 (MISCELLANEOUS) ×2 IMPLANT
CLIP APPLIE 9.375 MED OPEN (MISCELLANEOUS) ×1 IMPLANT
COVER BACK TABLE 60X90IN (DRAPES) ×2 IMPLANT
COVER MAYO STAND STRL (DRAPES) ×2 IMPLANT
COVER PROBE W GEL 5X96 (DRAPES) ×2 IMPLANT
DRAPE LAPAROSCOPIC ABDOMINAL (DRAPES) ×1 IMPLANT
DRAPE UTILITY XL STRL (DRAPES) ×2 IMPLANT
DRSG TEGADERM 4X4.75 (GAUZE/BANDAGES/DRESSINGS) ×3 IMPLANT
ELECT REM PT RETURN 9FT ADLT (ELECTROSURGICAL) ×2
ELECTRODE REM PT RTRN 9FT ADLT (ELECTROSURGICAL) ×1 IMPLANT
GAUZE SPONGE 4X4 12PLY STRL LF (GAUZE/BANDAGES/DRESSINGS) ×1 IMPLANT
GLOVE SURG ENC MOIS LTX SZ7 (GLOVE) ×2 IMPLANT
GLOVE SURG POLYISO LF SZ7 (GLOVE) ×1 IMPLANT
GLOVE SURG UNDER POLY LF SZ7.5 (GLOVE) ×2 IMPLANT
GOWN STRL REUS W/ TWL LRG LVL3 (GOWN DISPOSABLE) ×2 IMPLANT
GOWN STRL REUS W/TWL LRG LVL3 (GOWN DISPOSABLE) ×4
KIT MARKER MARGIN INK (KITS) ×2 IMPLANT
NDL HYPO 25X1 1.5 SAFETY (NEEDLE) ×1 IMPLANT
NEEDLE HYPO 25X1 1.5 SAFETY (NEEDLE) ×2 IMPLANT
NS IRRIG 1000ML POUR BTL (IV SOLUTION) ×1 IMPLANT
PACK BASIN DAY SURGERY FS (CUSTOM PROCEDURE TRAY) ×2 IMPLANT
PENCIL SMOKE EVACUATOR (MISCELLANEOUS) ×2 IMPLANT
SLEEVE SCD COMPRESS KNEE MED (STOCKING) ×2 IMPLANT
SPONGE T-LAP 4X18 ~~LOC~~+RFID (SPONGE) ×2 IMPLANT
STRIP CLOSURE SKIN 1/2X4 (GAUZE/BANDAGES/DRESSINGS) ×2 IMPLANT
SUT MON AB 4-0 PC3 18 (SUTURE) ×3 IMPLANT
SUT VIC AB 3-0 SH 27 (SUTURE) ×4
SUT VIC AB 3-0 SH 27X BRD (SUTURE) ×1 IMPLANT
SYR CONTROL 10ML LL (SYRINGE) ×2 IMPLANT
TOWEL GREEN STERILE FF (TOWEL DISPOSABLE) ×2 IMPLANT
TRAY FAXITRON CT DISP (TRAY / TRAY PROCEDURE) ×3 IMPLANT
TUBE CONNECTING 20X1/4 (TUBING) IMPLANT

## 2021-05-27 NOTE — Anesthesia Procedure Notes (Signed)
Procedure Name: LMA Insertion Date/Time: 05/27/2021 7:33 AM Performed by: Burna Cash, CRNA Pre-anesthesia Checklist: Patient identified, Emergency Drugs available, Suction available and Patient being monitored Patient Re-evaluated:Patient Re-evaluated prior to induction Oxygen Delivery Method: Circle system utilized Preoxygenation: Pre-oxygenation with 100% oxygen Induction Type: IV induction Ventilation: Mask ventilation without difficulty LMA: LMA inserted LMA Size: 4.0 Number of attempts: 1 Airway Equipment and Method: Bite block Placement Confirmation: positive ETCO2 Tube secured with: Tape Dental Injury: Teeth and Oropharynx as per pre-operative assessment

## 2021-05-27 NOTE — Interval H&P Note (Signed)
History and Physical Interval Note:  05/27/2021 7:15 AM  Shari Maldonado  has presented today for surgery, with the diagnosis of LEFT BREAST COMPLEX SCLEROSING LESION, RIGHT BREAST SCLEROSING ADENOSIS.  The various methods of treatment have been discussed with the patient and family. After consideration of risks, benefits and other options for treatment, the patient has consented to  Procedure(s): BILATERAL BREAST LUMPECTOMY WITH RADIOACTIVE SEED LOCALIZATION (Bilateral) as a surgical intervention.  The patient's history has been reviewed, patient examined, no change in status, stable for surgery.  I have reviewed the patient's chart and labs.  Questions were answered to the patient's satisfaction.     Shari Maldonado

## 2021-05-27 NOTE — Discharge Instructions (Addendum)
[facility] Office Phone Number [phone]  BREAST BIOPSY/ PARTIAL MASTECTOMY: POST OP INSTRUCTIONS  Always review your discharge instruction sheet given to you by the facility where your surgery was performed.  IF YOU HAVE DISABILITY OR FAMILY LEAVE FORMS, YOU MUST BRING THEM TO THE OFFICE FOR PROCESSING.  DO NOT GIVE THEM TO YOUR DOCTOR.  A prescription for pain medication may be given to you upon discharge.  Take your pain medication as prescribed, if needed.  If narcotic pain medicine is not needed, then you may take acetaminophen (Tylenol) or ibuprofen (Advil) as needed. Take your usually prescribed medications unless otherwise directed If you need a refill on your pain medication, please contact your pharmacy.  They will contact our office to request authorization.  Prescriptions will not be filled after 5pm or on week-ends. You should eat very light the first 24 hours after surgery, such as soup, crackers, pudding, etc.  Resume your normal diet the day after surgery. Most patients will experience some swelling and bruising in the breast.  Ice packs and a good support bra will help.  Swelling and bruising can take several days to resolve.  It is common to experience some constipation if taking pain medication after surgery.  Increasing fluid intake and taking a stool softener will usually help or prevent this problem from occurring.  A mild laxative (Milk of Magnesia or Miralax) should be taken according to package directions if there are no bowel movements after 48 hours. Unless discharge instructions indicate otherwise, you may remove your bandages 24-48 hours after surgery, and you may shower at that time.  You may have steri-strips (small skin tapes) in place directly over the incision.  These strips should be left on the skin for 7-10 days.  If your surgeon used skin glue on the incision, you may shower in 24 hours.  The glue will flake off over the next 2-3 weeks.  Any  sutures or staples will be removed at the office during your follow-up visit. ACTIVITIES:  You may resume regular daily activities (gradually increasing) beginning the next day.  Wearing a good support bra or sports bra minimizes pain and swelling.  You may have sexual intercourse when it is comfortable. You may drive when you no longer are taking prescription pain medication, you can comfortably wear a seatbelt, and you can safely maneuver your car and apply brakes. RETURN TO WORK:  ______________________________________________________________________________________ Dennis Bast should see your doctor in the office for a follow-up appointment approximately two weeks after your surgery.  Your doctors nurse will typically make your follow-up appointment when she calls you with your pathology report.  Expect your pathology report 2-3 business days after your surgery.  You may call to check if you do not hear from [place] after three days. OTHER INSTRUCTIONS: _______________________________________________________________________________________________ _____________________________________________________________________________________________________________________________________ _____________________________________________________________________________________________________________________________________ _____________________________________________________________________________________________________________________________________  WHEN TO CALL YOUR DOCTOR: Fever over 101.0 Nausea and/or vomiting. Extreme swelling or bruising. Continued bleeding from incision. Increased pain, redness, or drainage from the incision.  The clinic staff is available to answer your questions during regular business hours.  Please dont hesitate to call and ask to speak to one of the nurses for clinical concerns.  If you have a medical emergency, go to the nearest emergency room or call 911.  A surgeon from [facility] is always on call at the hospital.  For further questions, please visit [email or web address]      Post Anesthesia Home Care Instructions  Activity: Get plenty of rest for the  remainder of the day. A responsible individual must stay with you for 24 hours following the procedure.  For the next 24 hours, DO NOT: -Drive a car -Advertising copywriter -Drink alcoholic beverages -Take any medication unless instructed by your physician -Make any legal decisions or sign important papers.  Meals: Start with liquid foods such as gelatin or soup. Progress to regular foods as tolerated. Avoid greasy, spicy, heavy foods. If nausea and/or vomiting occur, drink only clear liquids until the nausea and/or vomiting subsides. Call your physician if vomiting continues.  Special Instructions/Symptoms: Your throat may feel dry or sore from the anesthesia or the breathing tube placed in your throat during surgery. If this causes discomfort, gargle with warm salt water. The discomfort should disappear within 24 hours.  If you had a scopolamine patch placed behind your ear for the management of post- operative nausea and/or vomiting:  1. The medication in the patch is effective for 72 hours, after which it should be removed.  Wrap patch in a tissue and discard in the trash. Wash hands thoroughly with soap and water. 2. You may remove the patch earlier than 72 hours if you experience unpleasant side effects which may include dry mouth, dizziness or visual disturbances. 3. Avoid touching the patch. Wash your hands with soap and water after contact with the patch.         Next dose of Tylenol can be given after 12:43 PM.

## 2021-05-27 NOTE — Anesthesia Postprocedure Evaluation (Signed)
Anesthesia Post Note  Patient: Shari Maldonado  Procedure(s) Performed: BILATERAL BREAST LUMPECTOMY WITH RADIOACTIVE SEED LOCALIZATION (Bilateral: Breast)     Patient location during evaluation: PACU Anesthesia Type: General Level of consciousness: awake and alert Pain management: pain level controlled Vital Signs Assessment: post-procedure vital signs reviewed and stable Respiratory status: spontaneous breathing, nonlabored ventilation, respiratory function stable and patient connected to nasal cannula oxygen Cardiovascular status: blood pressure returned to baseline and stable Postop Assessment: no apparent nausea or vomiting Anesthetic complications: no   No notable events documented.  Last Vitals:  Vitals:   05/27/21 0841 05/27/21 0845  BP: (!) 126/98 125/70  Pulse: 95 88  Resp: 15 13  Temp: 36.8 C   SpO2: 98% 98%    Last Pain:  Vitals:   05/27/21 0841  TempSrc:   PainSc: 0-No pain                 Velvie Thomaston

## 2021-05-27 NOTE — Transfer of Care (Signed)
Immediate Anesthesia Transfer of Care Note  Patient: Shari Maldonado  Procedure(s) Performed: BILATERAL BREAST LUMPECTOMY WITH RADIOACTIVE SEED LOCALIZATION (Bilateral: Breast)  Patient Location: PACU  Anesthesia Type:General  Level of Consciousness: sedated  Airway & Oxygen Therapy: Patient Spontanous Breathing and Patient connected to face mask oxygen  Post-op Assessment: Report given to RN and Post -op Vital signs reviewed and stable  Post vital signs: Reviewed and stable  Last Vitals:  Vitals Value Taken Time  BP    Temp    Pulse 95 05/27/21 0842  Resp 15 05/27/21 0842  SpO2 98 % 05/27/21 0842  Vitals shown include unvalidated device data.  Last Pain:  Vitals:   05/27/21 0639  TempSrc: Oral  PainSc: 0-No pain      Patients Stated Pain Goal: 3 (05/27/21 8003)  Complications: No notable events documented.

## 2021-05-27 NOTE — Op Note (Signed)
Pre-op Diagnosis:  Complex sclerosing lesion of left breast  Sclerosing adenosis/ breast mass right breast Post-op Diagnosis: same Procedure:  Bilateral radioactive seed localized lumpectomies Surgeon:  Ceejay Kegley K. Anesthesia:  GEN - LMA Indications:  This is a 51 year old female who presented with radiologic findings of bilateral breast masses.  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  Description of procedure: The patient is brought to the operating room placed in supine position on the operating room table. After an adequate level of general anesthesia was obtained, her chest was prepped with ChloraPrep and draped in sterile fashion. A timeout was taken to ensure the proper patient and proper procedure. We began on the right side.  We interrogated the breast with the neoprobe. We made a transverse incision over the radioactive seed after infiltrating with 0.25% Marcaine. Dissection was carried down in the breast tissue with cautery. We used the neoprobe to guide [place] towards the radioactive seed. We excised an area of tissue around the radioactive seed 2 cm. The specimen was removed and was oriented with a paint kit. Specimen mammogram showed the radioactive seed as well as the biopsy clip within the specimen. This was sent for pathologic examination. There is no residual radioactivity within the biopsy cavity. We inspected carefully for hemostasis. The wound was thoroughly irrigated. The wound was closed with a deep layer of 3-0 Vicryl and a subcuticular layer of 4-0 Monocryl.   We then turned our attention to the left side.  We made a transverse incision in the lateral breast after infiltrating with 0.25% Marcaine. Dissection was carried down in the breast tissue with cautery. We used the neoprobe to guide [place] towards the radioactive seed. We excised an area of tissue around the radioactive seed 2 cm in diameter. The specimen was  removed and was oriented with a paint kit. Specimen mammogram showed the radioactive seed as well as the biopsy clip within the specimen. This was sent for pathologic examination. There is no residual radioactivity within the biopsy cavity. We inspected carefully for hemostasis. The wound was thoroughly irrigated. The wound was closed with a deep layer of 3-0 Vicryl and a subcuticular layer of 4-0 Monocryl.  Benzoin Steri-Strips were applied to both incisions. The patient was then extubated and brought to the recovery room in stable condition. All sponge, instrument, and needle counts are correct.  Shari Maldonado. Georgette Dover, MD, [facility] Surgery  General/ Trauma Surgery  05/27/2021 8:43 AM

## 2021-05-28 ENCOUNTER — Encounter (HOSPITAL_BASED_OUTPATIENT_CLINIC_OR_DEPARTMENT_OTHER): Payer: Self-pay | Admitting: Surgery

## 2021-05-28 LAB — SURGICAL PATHOLOGY

## 2021-05-28 NOTE — Progress Notes (Signed)
Left message stating courtesy call and if any questions or concerns please call the doctors office.  

## 2021-06-26 ENCOUNTER — Encounter (HOSPITAL_COMMUNITY): Payer: Self-pay

## 2021-08-13 NOTE — H&P (Signed)
Subjective:  ?  ? Patient ID: Shari Maldonado is a 51 y.o. female. ?  ?HPI  ?  ?Returns for follow up discussion breast reduction. Screening MMG 01/2021 [facility]) showed possible left breast asymmetries. Patient reported swelling near axilla intermittent on left. Diagnostic MMG/US benign. MRI showed indeterminate areas of NME in left UOQ and right UIQ. MRI guided biopsy showed a CSL on left and right showed sclerosing adenosis. 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 ?  ?Current 42 D. Reports over year history neck and back and shoulder pain. This has not been relieved with over 6 month trial specialty fitted bras, chiropractor treatments, OTC pain mediation and hot cold packs. Notes rashes beneath breasts recurrent (couple times per year) that has not improved with hygiene measures deodorant and powder for over 3 month trial. Denies numbness hands. ?  ?Wt stable within 10 lbs of current. ?  ?No FH breast ca. ?  ?Works in Corporate treasurer for [employer]. Lives with spouse and kids ages 10, 42, 67.  ?  ?Review of Systems  ?Musculoskeletal: Positive for back pain.  ?  ?Remainder 12 point review negative ?   ?Objective:  ? Physical Exam ?Cardiovascular:  ?   Rate and Rhythm: Normal rate and regular rhythm.  ?   Heart sounds: Normal heart sounds.  ?Pulmonary:  ?   Effort: Pulmonary effort is normal.  ?   Breath sounds: Normal breath sounds.  ?Lymphadenopathy:  ?   Upper Body:  ?   Right upper body: No axillary adenopathy.  ?   Left upper body: No axillary adenopathy.   ?  ?Breasts: no palpable masses grade 3 ptosis bilateral ?Scars maturing over right upper pole transverse and left lateral radial scar ?SN to nipple R 31.5 L 31 cm ?BW R 24 L 24 cm ?Nipple to IMF R 11 L 11 cm ?  ?   ?Assessment:  ?   ?Macromastia ?Chronic neck and back pain ?Intertrigo ?   ?Plan:  ?   ?Chronic neck and back pain, intertrigo that has failed conservative  measures in setting of macromastia. No other cause of back pain or rashes noted. Breast reduction surgery has reasonable expectation to improve symptoms and functional impariment. ?  ?Reviewed reduction with anchor type scars, OP surgery, drains, post operative visits and limitations, recovery. Diminished sensation nipple and breast skin, risk of nipple loss, wound healing problems, asymmetry, incidental carcinoma, changes with wt gain/loss, aging, unacceptable cosmetic appearance reviewed.  ?  ?Additional risks including but not limited to bleeding seroma hematoma need for additional procedures damage to adjacent structures infection blood clots in legs or lungs reviewed. ?  ?Drain teaching completed. Rx for tramadol given. ?  ?Anticipate 640 g reduction from each breast. Counseled cannot assure her cup size.  ?  ? ?

## 2021-08-14 ENCOUNTER — Encounter (HOSPITAL_BASED_OUTPATIENT_CLINIC_OR_DEPARTMENT_OTHER): Payer: Self-pay | Admitting: Plastic Surgery

## 2021-08-14 ENCOUNTER — Other Ambulatory Visit: Payer: Self-pay

## 2021-08-21 NOTE — Anesthesia Preprocedure Evaluation (Addendum)
Anesthesia Evaluation  ?Patient identified by MRN, date of birth, ID band ?Patient awake ? ? ? ?Reviewed: ?Allergy & Precautions, NPO status , Patient's Chart, lab work & pertinent test results ? ?History of Anesthesia Complications ?(+) PONV ? ?Airway ?Mallampati: II ? ?TM Distance: >3 FB ?Neck ROM: Full ? ? ? Dental ? ?(+) Dental Advisory Given ?  ?Pulmonary ?neg pulmonary ROS,  ?  ?breath sounds clear to auscultation ? ? ? ? ? ? Cardiovascular ?hypertension, Pt. on medications and Pt. on home beta blockers ?(-) angina ?Rhythm:Regular Rate:Normal ? ?'19 Stress test: normal perfusion ?'18 Stress test: normal ?  ?Neuro/Psych ?negative neurological ROS ?   ? GI/Hepatic ?Neg liver ROS, GERD  Controlled and Medicated,  ?Endo/Other  ?negative endocrine ROS ? Renal/GU ?negative Renal ROS  ? ?  ?Musculoskeletal ? ? Abdominal ?  ?Peds ? Hematology ?negative hematology ROS ?(+)   ?Anesthesia Other Findings ? ? Reproductive/Obstetrics ? ?  ? ? ? ? ? ? ? ? ? ? ? ? ? ?  ?  ? ? ? ? ? ? ? ?Anesthesia Physical ?Anesthesia Plan ? ?ASA: 2 ? ?Anesthesia Plan:   ? ?Post-op Pain Management: Tylenol PO (pre-op)*  ? ?Induction: Intravenous ? ?PONV Risk Score and Plan: 2 and Ondansetron and Dexamethasone ? ?Airway Management Planned: Oral ETT ? ?Additional Equipment: None ? ?Intra-op Plan:  ? ?Post-operative Plan: Extubation in OR ? ?Informed Consent: I have reviewed the patients History and Physical, chart, labs and discussed the procedure including the risks, benefits and alternatives for the proposed anesthesia with the patient or authorized representative who has indicated his/her understanding and acceptance.  ? ? ? ?Dental advisory given ? ?Plan Discussed with: CRNA and Surgeon ? ?Anesthesia Plan Comments:   ? ? ? ? ? ?Anesthesia Quick Evaluation ? ?

## 2021-08-22 ENCOUNTER — Encounter (HOSPITAL_BASED_OUTPATIENT_CLINIC_OR_DEPARTMENT_OTHER)
Admission: RE | Disposition: A | Discharge status: Home / Self Care | Payer: Self-pay | Source: Home / Self Care | Attending: Plastic Surgery

## 2021-08-22 ENCOUNTER — Encounter (HOSPITAL_BASED_OUTPATIENT_CLINIC_OR_DEPARTMENT_OTHER): Payer: Self-pay | Admitting: Plastic Surgery

## 2021-08-22 ENCOUNTER — Ambulatory Visit (HOSPITAL_BASED_OUTPATIENT_CLINIC_OR_DEPARTMENT_OTHER): Payer: BC Managed Care – PPO | Admitting: Anesthesiology

## 2021-08-22 ENCOUNTER — Other Ambulatory Visit: Payer: Self-pay

## 2021-08-22 ENCOUNTER — Ambulatory Visit (HOSPITAL_BASED_OUTPATIENT_CLINIC_OR_DEPARTMENT_OTHER)
Admission: RE | Admit: 2021-08-22 | Discharge: 2021-08-22 | Disposition: A | Discharge status: Home / Self Care | Payer: BC Managed Care – PPO | Attending: Plastic Surgery | Admitting: Plastic Surgery

## 2021-08-22 DIAGNOSIS — N62 Hypertrophy of breast: Secondary | ICD-10-CM | POA: Diagnosis present

## 2021-08-22 DIAGNOSIS — N6021 Fibroadenosis of right breast: Secondary | ICD-10-CM | POA: Diagnosis not present

## 2021-08-22 DIAGNOSIS — I10 Essential (primary) hypertension: Secondary | ICD-10-CM | POA: Diagnosis not present

## 2021-08-22 DIAGNOSIS — M549 Dorsalgia, unspecified: Secondary | ICD-10-CM | POA: Diagnosis not present

## 2021-08-22 DIAGNOSIS — L304 Erythema intertrigo: Secondary | ICD-10-CM | POA: Insufficient documentation

## 2021-08-22 DIAGNOSIS — M542 Cervicalgia: Secondary | ICD-10-CM | POA: Diagnosis not present

## 2021-08-22 DIAGNOSIS — N6022 Fibroadenosis of left breast: Secondary | ICD-10-CM | POA: Diagnosis not present

## 2021-08-22 DIAGNOSIS — K219 Gastro-esophageal reflux disease without esophagitis: Secondary | ICD-10-CM | POA: Diagnosis not present

## 2021-08-22 DIAGNOSIS — G8929 Other chronic pain: Secondary | ICD-10-CM | POA: Diagnosis not present

## 2021-08-22 HISTORY — PX: REDUCTION MAMMAPLASTY: SUR839

## 2021-08-22 HISTORY — PX: BREAST REDUCTION SURGERY: SHX8

## 2021-08-22 SURGERY — MAMMOPLASTY, REDUCTION
Anesthesia: General | Site: Breast | Laterality: Bilateral

## 2021-08-22 MED ORDER — FENTANYL CITRATE (PF) 100 MCG/2ML IJ SOLN
INTRAMUSCULAR | Status: AC
  Filled 2021-08-22: qty 2

## 2021-08-22 MED ORDER — MIDAZOLAM HCL 5 MG/5ML IJ SOLN
INTRAMUSCULAR | Status: DC | PRN
  Administered 2021-08-22: 2 mg via INTRAVENOUS

## 2021-08-22 MED ORDER — OXYCODONE HCL 5 MG PO TABS: 5.0000 mg | ORAL_TABLET | Freq: Once | ORAL | Status: DC | PRN

## 2021-08-22 MED ORDER — EPHEDRINE 5 MG/ML INJ
INTRAVENOUS | Status: AC
  Filled 2021-08-22: qty 5

## 2021-08-22 MED ORDER — AMISULPRIDE (ANTIEMETIC) 5 MG/2ML IV SOLN
10.0000 mg | Freq: Once | INTRAVENOUS | Status: AC
  Administered 2021-08-22: 10 mg via INTRAVENOUS

## 2021-08-22 MED ORDER — ACETAMINOPHEN 500 MG PO TABS
1000.0000 mg | ORAL_TABLET | Freq: Once | ORAL | Status: AC
  Administered 2021-08-22: 1000 mg via ORAL

## 2021-08-22 MED ORDER — FENTANYL CITRATE (PF) 100 MCG/2ML IJ SOLN
INTRAMUSCULAR | Status: DC | PRN
  Administered 2021-08-22 (×2): 50 ug via INTRAVENOUS
  Administered 2021-08-22: 100 ug via INTRAVENOUS
  Administered 2021-08-22: 50 ug via INTRAVENOUS

## 2021-08-22 MED ORDER — CELECOXIB 200 MG PO CAPS
ORAL_CAPSULE | ORAL | Status: AC
  Filled 2021-08-22: qty 1

## 2021-08-22 MED ORDER — CHLORHEXIDINE GLUCONATE CLOTH 2 % EX PADS: 6.0000 | MEDICATED_PAD | Freq: Once | CUTANEOUS | Status: DC

## 2021-08-22 MED ORDER — FENTANYL CITRATE (PF) 100 MCG/2ML IJ SOLN
INTRAMUSCULAR | Status: AC
Start: 2021-08-22 — End: ?
  Filled 2021-08-22: qty 2

## 2021-08-22 MED ORDER — CELECOXIB 200 MG PO CAPS
200.0000 mg | ORAL_CAPSULE | ORAL | Status: AC
  Administered 2021-08-22: 200 mg via ORAL

## 2021-08-22 MED ORDER — GABAPENTIN 300 MG PO CAPS
ORAL_CAPSULE | ORAL | Status: AC
  Filled 2021-08-22: qty 1

## 2021-08-22 MED ORDER — HYDROMORPHONE HCL 1 MG/ML IJ SOLN
0.2500 mg | INTRAMUSCULAR | Status: DC | PRN
  Administered 2021-08-22 (×2): 0.5 mg via INTRAVENOUS

## 2021-08-22 MED ORDER — LACTATED RINGERS IV SOLN: INTRAVENOUS | Status: DC

## 2021-08-22 MED ORDER — ACETAMINOPHEN 500 MG PO TABS
ORAL_TABLET | ORAL | Status: AC
  Filled 2021-08-22: qty 2

## 2021-08-22 MED ORDER — MIDAZOLAM HCL 2 MG/2ML IJ SOLN
INTRAMUSCULAR | Status: AC
  Filled 2021-08-22: qty 2

## 2021-08-22 MED ORDER — PHENYLEPHRINE 80 MCG/ML (10ML) SYRINGE FOR IV PUSH (FOR BLOOD PRESSURE SUPPORT)
PREFILLED_SYRINGE | INTRAVENOUS | Status: AC
  Filled 2021-08-22: qty 10

## 2021-08-22 MED ORDER — BUPIVACAINE HCL (PF) 0.25 % IJ SOLN
INTRAMUSCULAR | Status: AC
  Filled 2021-08-22: qty 30

## 2021-08-22 MED ORDER — BUPIVACAINE HCL (PF) 0.25 % IJ SOLN
INTRAMUSCULAR | Status: DC | PRN
Start: 2021-08-22 — End: 2021-08-22
  Administered 2021-08-22: 30 mL

## 2021-08-22 MED ORDER — 0.9 % SODIUM CHLORIDE (POUR BTL) OPTIME
TOPICAL | Status: DC | PRN
Start: 2021-08-22 — End: 2021-08-22
  Administered 2021-08-22: 500 mL

## 2021-08-22 MED ORDER — CEFAZOLIN SODIUM-DEXTROSE 2-4 GM/100ML-% IV SOLN
2.0000 g | INTRAVENOUS | Status: AC
  Administered 2021-08-22: 2 g via INTRAVENOUS

## 2021-08-22 MED ORDER — MIDAZOLAM HCL 2 MG/2ML IJ SOLN: 0.5000 mg | Freq: Once | INTRAMUSCULAR | Status: DC | PRN

## 2021-08-22 MED ORDER — ROCURONIUM BROMIDE 10 MG/ML (PF) SYRINGE
PREFILLED_SYRINGE | INTRAVENOUS | Status: AC
  Filled 2021-08-22: qty 10

## 2021-08-22 MED ORDER — LIDOCAINE HCL (CARDIAC) PF 100 MG/5ML IV SOSY
PREFILLED_SYRINGE | INTRAVENOUS | Status: DC | PRN
  Administered 2021-08-22: 40 mg via INTRAVENOUS

## 2021-08-22 MED ORDER — SUCCINYLCHOLINE CHLORIDE 200 MG/10ML IV SOSY
PREFILLED_SYRINGE | INTRAVENOUS | Status: AC
  Filled 2021-08-22: qty 10

## 2021-08-22 MED ORDER — SCOPOLAMINE 1 MG/3DAYS TD PT72SCOPOLAMINE 1 MG/3DAYS
MEDICATED_PATCH | TRANSDERMAL | Status: DC | PRN
Start: 2021-08-22 — End: 2021-08-22
  Administered 2021-08-22: 1 via TRANSDERMAL

## 2021-08-22 MED ORDER — ATROPINE SULFATE 0.4 MG/ML IV SOLN
INTRAVENOUS | Status: AC
  Filled 2021-08-22: qty 1

## 2021-08-22 MED ORDER — SCOPOLAMINE 1 MG/3DAYS TD PT72
MEDICATED_PATCH | TRANSDERMAL | Status: AC
  Filled 2021-08-22: qty 1

## 2021-08-22 MED ORDER — MEPERIDINE HCL 25 MG/ML IJ SOLN: 6.2500 mg | INTRAMUSCULAR | Status: DC | PRN

## 2021-08-22 MED ORDER — SUGAMMADEX SODIUM 200 MG/2ML IV SOLN
INTRAVENOUS | Status: DC | PRN
  Administered 2021-08-22: 200 mg via INTRAVENOUS

## 2021-08-22 MED ORDER — BUPIVACAINE-EPINEPHRINE (PF) 0.25% -1:200000 IJ SOLN
INTRAMUSCULAR | Status: AC
  Filled 2021-08-22: qty 30

## 2021-08-22 MED ORDER — HYDROMORPHONE HCL 1 MG/ML IJ SOLN
INTRAMUSCULAR | Status: AC
  Filled 2021-08-22: qty 0.5

## 2021-08-22 MED ORDER — ACETAMINOPHEN 500 MG PO TABS: 1000.0000 mg | ORAL_TABLET | ORAL | Status: DC

## 2021-08-22 MED ORDER — DEXAMETHASONE SODIUM PHOSPHATE 4 MG/ML IJ SOLN
INTRAMUSCULAR | Status: DC | PRN
  Administered 2021-08-22: 10 mg via INTRAVENOUS

## 2021-08-22 MED ORDER — ROCURONIUM BROMIDE 100 MG/10ML IV SOLN
INTRAVENOUS | Status: DC | PRN
  Administered 2021-08-22: 80 mg via INTRAVENOUS

## 2021-08-22 MED ORDER — PROPOFOL 10 MG/ML IV BOLUS
INTRAVENOUS | Status: DC | PRN
  Administered 2021-08-22: 200 mg via INTRAVENOUS

## 2021-08-22 MED ORDER — LIDOCAINE 2% (20 MG/ML) 5 ML SYRINGE
INTRAMUSCULAR | Status: AC
  Filled 2021-08-22: qty 5

## 2021-08-22 MED ORDER — AMISULPRIDE (ANTIEMETIC) 5 MG/2ML IV SOLN
INTRAVENOUS | Status: AC
  Filled 2021-08-22: qty 2

## 2021-08-22 MED ORDER — OXYCODONE HCL 5 MG/5ML PO SOLN: 5.0000 mg | Freq: Once | ORAL | Status: DC | PRN

## 2021-08-22 MED ORDER — CEFAZOLIN SODIUM-DEXTROSE 2-4 GM/100ML-% IV SOLN
INTRAVENOUS | Status: AC
  Filled 2021-08-22: qty 100

## 2021-08-22 MED ORDER — GABAPENTIN 300 MG PO CAPS
300.0000 mg | ORAL_CAPSULE | ORAL | Status: AC
  Administered 2021-08-22: 300 mg via ORAL

## 2021-08-22 MED ORDER — ONDANSETRON HCL 4 MG/2ML IJ SOLN
INTRAMUSCULAR | Status: DC | PRN
  Administered 2021-08-22: 4 mg via INTRAVENOUS

## 2021-08-22 MED ORDER — DEXAMETHASONE SODIUM PHOSPHATE 10 MG/ML IJ SOLN
INTRAMUSCULAR | Status: AC
  Filled 2021-08-22: qty 1

## 2021-08-22 MED ORDER — PROPOFOL 500 MG/50ML IV EMUL
INTRAVENOUS | Status: DC | PRN
  Administered 2021-08-22: 25 ug/kg/min via INTRAVENOUS

## 2021-08-22 MED ORDER — ONDANSETRON HCL 4 MG/2ML IJ SOLN
INTRAMUSCULAR | Status: AC
  Filled 2021-08-22: qty 2

## 2021-08-22 SURGICAL SUPPLY — 57 items
BINDER BREAST 3XL (GAUZE/BANDAGES/DRESSINGS) IMPLANT
BINDER BREAST LRG (GAUZE/BANDAGES/DRESSINGS) IMPLANT
BINDER BREAST MEDIUM (GAUZE/BANDAGES/DRESSINGS) IMPLANT
BINDER BREAST XLRG (GAUZE/BANDAGES/DRESSINGS) ×1 IMPLANT
BINDER BREAST XXLRG (GAUZE/BANDAGES/DRESSINGS) IMPLANT
BLADE SURG 10 STRL SS (BLADE) ×7 IMPLANT
BNDG GAUZE ELAST 4 BULKY (GAUZE/BANDAGES/DRESSINGS) ×4 IMPLANT
CANISTER SUCT 1200ML W/VALVE (MISCELLANEOUS) ×2 IMPLANT
CHLORAPREP W/TINT 26 (MISCELLANEOUS) ×4 IMPLANT
COVER BACK TABLE 60X90IN (DRAPES) ×2 IMPLANT
COVER MAYO STAND STRL (DRAPES) ×2 IMPLANT
DERMABOND ADVANCED (GAUZE/BANDAGES/DRESSINGS) ×2
DERMABOND ADVANCED .7 DNX12 (GAUZE/BANDAGES/DRESSINGS) ×2 IMPLANT
DRAIN CHANNEL 15F RND FF W/TCR (WOUND CARE) ×2 IMPLANT
DRAPE TOP ARMCOVERS (MISCELLANEOUS) ×2 IMPLANT
DRAPE U-SHAPE 76X120 STRL (DRAPES) ×2 IMPLANT
DRAPE UTILITY XL STRL (DRAPES) ×2 IMPLANT
DRSG PAD ABDOMINAL 8X10 ST (GAUZE/BANDAGES/DRESSINGS) ×4 IMPLANT
ELECT COATED BLADE 2.86 ST (ELECTRODE) ×2 IMPLANT
ELECT REM PT RETURN 9FT ADLT (ELECTROSURGICAL) ×2
ELECTRODE REM PT RTRN 9FT ADLT (ELECTROSURGICAL) ×1 IMPLANT
EVACUATOR SILICONE 100CC (DRAIN) IMPLANT
GLOVE BIO SURGEON STRL SZ 6 (GLOVE) ×4 IMPLANT
GLOVE BIO SURGEON STRL SZ 6.5 (GLOVE) ×2 IMPLANT
GLOVE BIOGEL PI IND STRL 6.5 (GLOVE) IMPLANT
GLOVE BIOGEL PI IND STRL 7.0 (GLOVE) IMPLANT
GLOVE BIOGEL PI INDICATOR 6.5 (GLOVE) ×3
GLOVE BIOGEL PI INDICATOR 7.0 (GLOVE) ×1
GLOVE ECLIPSE 6.5 STRL STRAW (GLOVE) ×1 IMPLANT
GOWN STRL REUS W/ TWL LRG LVL3 (GOWN DISPOSABLE) ×2 IMPLANT
GOWN STRL REUS W/ TWL XL LVL3 (GOWN DISPOSABLE) IMPLANT
GOWN STRL REUS W/TWL LRG LVL3 (GOWN DISPOSABLE) ×2
GOWN STRL REUS W/TWL XL LVL3 (GOWN DISPOSABLE) ×2
MARKER SKIN DUAL TIP RULER LAB (MISCELLANEOUS) IMPLANT
NDL HYPO 25X1 1.5 SAFETY (NEEDLE) IMPLANT
NEEDLE HYPO 25X1 1.5 SAFETY (NEEDLE) ×2 IMPLANT
NS IRRIG 1000ML POUR BTL (IV SOLUTION) ×2 IMPLANT
PACK BASIN DAY SURGERY FS (CUSTOM PROCEDURE TRAY) ×2 IMPLANT
PENCIL SMOKE EVACUATOR (MISCELLANEOUS) ×2 IMPLANT
PIN SAFETY STERILE (MISCELLANEOUS) ×2 IMPLANT
SHEET MEDIUM DRAPE 40X70 STRL (DRAPES) ×3 IMPLANT
SLEEVE SCD COMPRESS KNEE MED (STOCKING) ×2 IMPLANT
SPONGE T-LAP 18X18 ~~LOC~~+RFID (SPONGE) ×7 IMPLANT
STAPLER VISISTAT 35W (STAPLE) ×2 IMPLANT
SUT ETHILON 2 0 FS 18 (SUTURE) ×3 IMPLANT
SUT MNCRL AB 4-0 PS2 18 (SUTURE) ×4 IMPLANT
SUT VIC AB 3-0 PS1 18 (SUTURE) ×6
SUT VIC AB 3-0 PS1 18XBRD (SUTURE) ×6 IMPLANT
SUT VICRYL 4-0 PS2 18IN ABS (SUTURE) ×6 IMPLANT
SUT VLOC 180 P-14 24 (SUTURE) ×2 IMPLANT
SYR BULB IRRIG 60ML STRL (SYRINGE) ×2 IMPLANT
SYR CONTROL 10ML LL (SYRINGE) ×1 IMPLANT
TAPE MEASURE VINYL STERILE (MISCELLANEOUS) IMPLANT
TOWEL GREEN STERILE FF (TOWEL DISPOSABLE) ×4 IMPLANT
TUBE CONNECTING 20X1/4 (TUBING) ×2 IMPLANT
UNDERPAD 30X36 HEAVY ABSORB (UNDERPADS AND DIAPERS) ×4 IMPLANT
YANKAUER SUCT BULB TIP NO VENT (SUCTIONS) ×2 IMPLANT

## 2021-08-22 NOTE — Interval H&P Note (Signed)
History and Physical Interval Note: ? ?08/22/2021 ?6:43 AM ? ?Shari Maldonado  has presented today for surgery, with the diagnosis of macromastia, intertrigo, chronic neck and back pain.  The various methods of treatment have been discussed with the patient and family. After consideration of risks, benefits and other options for treatment, the patient has consented to  Procedure(s): ?MAMMARY REDUCTION  (BREAST) (Bilateral) as a surgical intervention.  The patient's history has been reviewed, patient examined, no change in status, stable for surgery.  I have reviewed the patient's chart and labs.  Questions were answered to the patient's satisfaction.   ? ? ?Norleen Xie ? ? ?

## 2021-08-22 NOTE — Anesthesia Procedure Notes (Signed)
Procedure Name: Intubation ?Date/Time: 08/22/2021 7:33 AM ?Performed by: Willa Frater, CRNA ?Pre-anesthesia Checklist: Patient identified, Emergency Drugs available, Suction available and Patient being monitored ?Patient Re-evaluated:Patient Re-evaluated prior to induction ?Oxygen Delivery Method: Circle system utilized ?Preoxygenation: Pre-oxygenation with 100% oxygen ?Induction Type: IV induction ?Ventilation: Mask ventilation without difficulty ?Laryngoscope Size: Mac and 3 ?Grade View: Grade I ?Tube type: Oral ?Tube size: 7.0 mm ?Number of attempts: 1 ?Airway Equipment and Method: Stylet and Oral airway ?Placement Confirmation: ETT inserted through vocal cords under direct vision, positive ETCO2 and breath sounds checked- equal and bilateral ?Secured at: 23 cm ?Tube secured with: Tape ?Dental Injury: Teeth and Oropharynx as per pre-operative assessment  ? ? ? ? ?

## 2021-08-22 NOTE — Anesthesia Postprocedure Evaluation (Signed)
Anesthesia Post Note ? ?Patient: RICHELE CHITTUM ? ?Procedure(s) Performed: MAMMARY REDUCTION  (BREAST) (Bilateral: Breast) ? ?  ? ?Patient location during evaluation: PACU ?Anesthesia Type: General ?Level of consciousness: awake and alert, patient cooperative and oriented ?Pain management: pain level controlled ?Vital Signs Assessment: post-procedure vital signs reviewed and stable ?Respiratory status: spontaneous breathing, nonlabored ventilation and respiratory function stable ?Cardiovascular status: blood pressure returned to baseline and stable ?Postop Assessment: no apparent nausea or vomiting and able to ambulate (nausea improved) ?Anesthetic complications: no ? ? ?No notable events documented. ? ?Last Vitals:  ?Vitals:  ? 08/22/21 1245 08/22/21 1318  ?BP: 112/67   ?Pulse: 61   ?Resp: 12   ?Temp:    ?SpO2: 92% 94%  ?  ?Last Pain:  ?Vitals:  ? 08/22/21 1415  ?TempSrc:   ?PainSc: 3   ? ? ?  ?  ?  ?  ?  ?  ? ?Reiana Poteet,E. Afton Mikelson ? ? ? ? ?

## 2021-08-22 NOTE — Transfer of Care (Signed)
Immediate Anesthesia Transfer of Care Note ? ?Patient: Shari Maldonado ? ?Procedure(s) Performed: MAMMARY REDUCTION  (BREAST) (Bilateral: Breast) ? ?Patient Location: PACU ? ?Anesthesia Type:General ? ?Level of Consciousness: awake, alert , oriented and drowsy ? ?Airway & Oxygen Therapy: Patient Spontanous Breathing and Patient connected to face mask oxygen ? ?Post-op Assessment: Report given to RN and Post -op Vital signs reviewed and stable ? ?Post vital signs: Reviewed and stable ? ?Last Vitals:  ?Vitals Value Taken Time  ?BP    ?Temp    ?Pulse 79 08/22/21 1052  ?Resp 14 08/22/21 1052  ?SpO2 96 % 08/22/21 1052  ?Vitals shown include unvalidated device data. ? ?Last Pain:  ?Vitals:  ? 08/22/21 0642  ?TempSrc: Oral  ?PainSc: 0-No pain  ?   ? ?Patients Stated Pain Goal: 4 (08/22/21 [id number]) ? ?Complications: No notable events documented. ?

## 2021-08-22 NOTE — Op Note (Signed)
Operative Note  ? ?DATE OF OPERATION: 5.12.23 ? ?LOCATION: [facility]-outpatient ? ?SURGICAL DIVISION: Plastic Surgery ? ?PREOPERATIVE DIAGNOSES:  1. Macromastia 2. Chronic neck and back pain 3. Intertrigo ? ?POSTOPERATIVE DIAGNOSES:  same ? ?PROCEDURE:  Bilateral breast reduction ? ?SURGEON: Irene Limbo MD MBA ? ?ASSISTANT: none ? ?ANESTHESIA:  General.  ? ?EBL: 35 ml ? ?COMPLICATIONS: None immediate.  ? ?INDICATIONS FOR PROCEDURE:  ?The patient, Shari Maldonado, is a 51 y.o. female born on Oct 26, 1970, is here for treatment chronic neck and back pain intertrigo in setting macromastia that has failed conservative management. ?  ?FINDINGS: Right reduction 332 g Left reduction 287 g. Seroma cavity present right superior pole breast from prior lumpectomy. ? ?DESCRIPTION OF PROCEDURE:  ?The patient was marked standing in the preoperative area to mark sternal notch, chest midline, anterior axillary lines, inframammary folds. The location of new nipple areolar complex was marked at level of on inframammary fold on anterior surface breast by palpation. This was marked symmetric over bilateral breasts. With aid of Wise pattern marker, location of new nipple areolar complex and vertical limbs (7 cm) were marked by displacement of breasts along meridian. The patient was taken to the operating room. SCDs were placed and IV antibiotics were given. The patient's operative site was prepped and draped in a sterile fashion. A time out was performed and all information was confirmed to be correct.   ?  ?Over left breast, superior medial pedicle marked and nipple areolar complex incised with 42 mm diameter marker. Pedicle deepithlialized and developed to chest wall. Breast tissue resected over lower pole. Medial and lateral flaps developed. Additional lateral and superior breast tissue excised. Breast tailor tacked closed.  ?  ?I then directed attention to right breast where superior medial pedicle designed. NAC marked with  42 mm diameter marker. The pedicle was deepithelialized. Pedicle developed to chest wall. Breast tissue resected over lower pole. Medial and lateral flaps developed. Additional lateral and superior pole breast tissue excised. Seroma cavity present over right superior pole breast, drained and excised. Breast tailor tacked closed and assessed for symmetry. Patient returned to supine position. Breast cavities irrigated and hemostasis obtained. Local anesthetic infiltrated throughout each breast. 15 Fr JP placed in each breast and secured with 2-0 nylon. Closure completed bilateral with 3-0 vicryl to approximate dermis along inframammary fold and vertical limb. NAC inset with 4-0 vicryl in dermis. Skin closure completed with 4-0 monocryl subcuticular throughout. Tissue adhesive applied. Dry dressing and breast binder applied. ? ?The patient was allowed to wake from anesthesia, extubated and taken to the recovery room in satisfactory condition.  ? ?SPECIMENS: right and left breast reduction ? ?DRAINS: 15 Fr JP in right and left breast ? ?Irene Limbo, MD MBA ?Plastic & Reconstructive Surgery ? ?Office/ physician access line after hours [phone]  ? ?

## 2021-08-22 NOTE — Discharge Instructions (Signed)
?  Post Anesthesia Home Care Instructions ? ?Activity: ?Get plenty of rest for the remainder of the day. A responsible individual must stay with you for 24 hours following the procedure.  ?For the next 24 hours, DO NOT: ?-Drive a car ?-Advertising copywriter ?-Drink alcoholic beverages ?-Take any medication unless instructed by your physician ?-Make any legal decisions or sign important papers. ? ?Meals: ?Start with liquid foods such as gelatin or soup. Progress to regular foods as tolerated. Avoid greasy, spicy, heavy foods. If nausea and/or vomiting occur, drink only clear liquids until the nausea and/or vomiting subsides. Call your physician if vomiting continues. ? ?Special Instructions/Symptoms: ?Your throat may feel dry or sore from the anesthesia or the breathing tube placed in your throat during surgery. If this causes discomfort, gargle with warm salt water. The discomfort should disappear within 24 hours. ? ?If you had a scopolamine patch placed behind your ear for the management of post- operative nausea and/or vomiting: ? ?1. The medication in the patch is effective for 72 hours, after which it should be removed.  Wrap patch in a tissue and discard in the trash. Wash hands thoroughly with soap and water. ?2. You may remove the patch earlier than 72 hours if you experience unpleasant side effects which may include dry mouth, dizziness or visual disturbances. ?3. Avoid touching the patch. Wash your hands with soap and water after contact with the patch. ?    ?Post Anesthesia Home Care Instructions ? ?Activity: ?Get plenty of rest for the remainder of the day. A responsible individual must stay with you for 24 hours following the procedure.  ?For the next 24 hours, DO NOT: ?-Drive a car ?-Advertising copywriter ?-Drink alcoholic beverages ?-Take any medication unless instructed by your physician ?-Make any legal decisions or sign important papers. ? ?Meals: ?Start with liquid foods such as gelatin or soup. Progress to  regular foods as tolerated. Avoid greasy, spicy, heavy foods. If nausea and/or vomiting occur, drink only clear liquids until the nausea and/or vomiting subsides. Call your physician if vomiting continues. ? ?Special Instructions/Symptoms: ?Your throat may feel dry or sore from the anesthesia or the breathing tube placed in your throat during surgery. If this causes discomfort, gargle with warm salt water. The discomfort should disappear within 24 hours. ? ?If you had a scopolamine patch placed behind your ear for the management of post- operative nausea and/or vomiting: ? ?1. The medication in the patch is effective for 72 hours, after which it should be removed.  Wrap patch in a tissue and discard in the trash. Wash hands thoroughly with soap and water. ?2. You may remove the patch earlier than 72 hours if you experience unpleasant side effects which may include dry mouth, dizziness or visual disturbances. ?3. Avoid touching the patch. Wash your hands with soap and water after contact with the patch. ? ?No tylenol or ibuprofen until after 12:45 if needed. ?    ?

## 2021-08-25 ENCOUNTER — Encounter (HOSPITAL_BASED_OUTPATIENT_CLINIC_OR_DEPARTMENT_OTHER): Payer: Self-pay | Admitting: Plastic Surgery

## 2021-08-25 LAB — SURGICAL PATHOLOGY

## 2022-02-09 ENCOUNTER — Other Ambulatory Visit: Payer: Self-pay | Admitting: Nurse Practitioner

## 2022-02-09 DIAGNOSIS — Z1231 Encounter for screening mammogram for malignant neoplasm of breast: Secondary | ICD-10-CM

## 2022-03-09 ENCOUNTER — Ambulatory Visit
Admission: RE | Admit: 2022-03-09 | Discharge: 2022-03-09 | Disposition: A | Discharge status: Home / Self Care | Payer: BC Managed Care – PPO | Source: Ambulatory Visit | Attending: Nurse Practitioner | Admitting: Nurse Practitioner

## 2022-03-09 DIAGNOSIS — Z1231 Encounter for screening mammogram for malignant neoplasm of breast: Secondary | ICD-10-CM

## 2022-10-05 IMAGING — MG MM PLC BREAST LOC DEV 1ST LESION INC MAMMO GUIDE*L*
7 series · 7 of 7 positions shown · non-contrast
Comparison: Previous exam(s).

CLINICAL DATA: Recent MRI-guided biopsy of the LEFT breast with
pathology result of complex sclerosing lesion. Patient is scheduled
for surgical excision requiring preoperative radioactive seed
localization.

[L CC (1 of 4)]
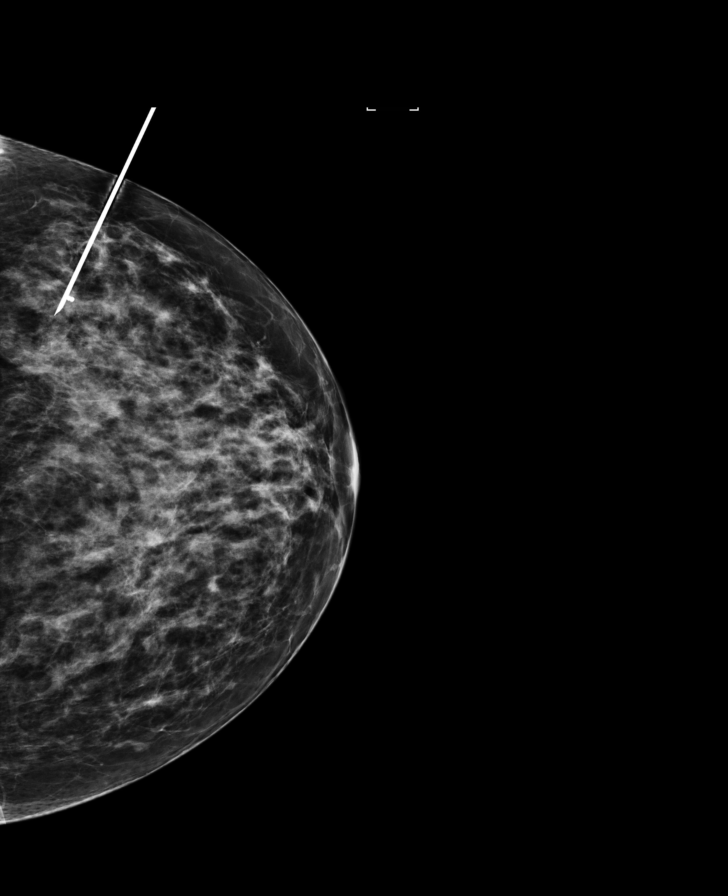

[L CC (2 of 4)]
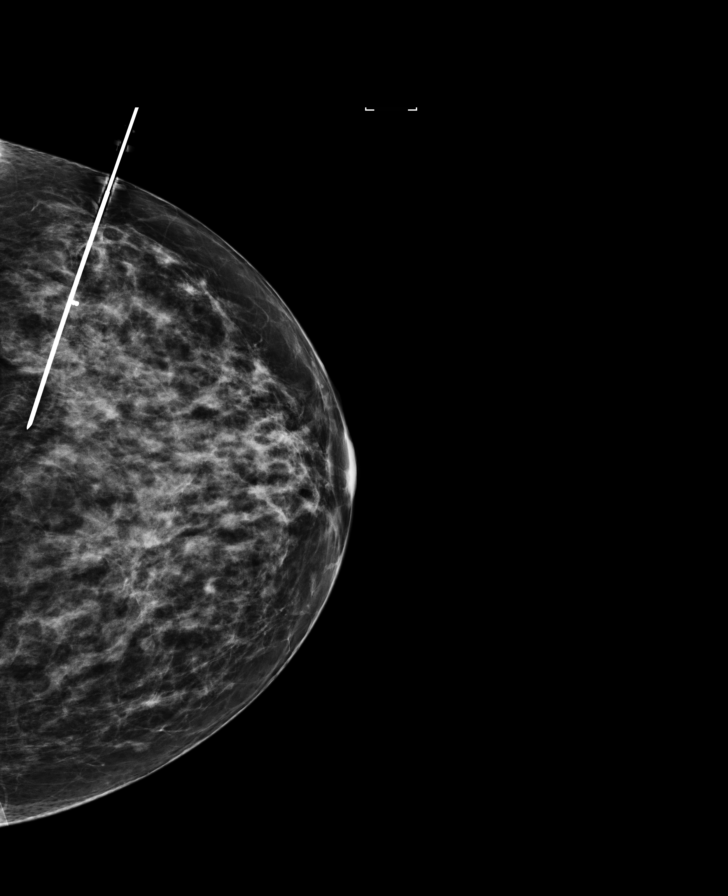

[L LM (1 of 3)]
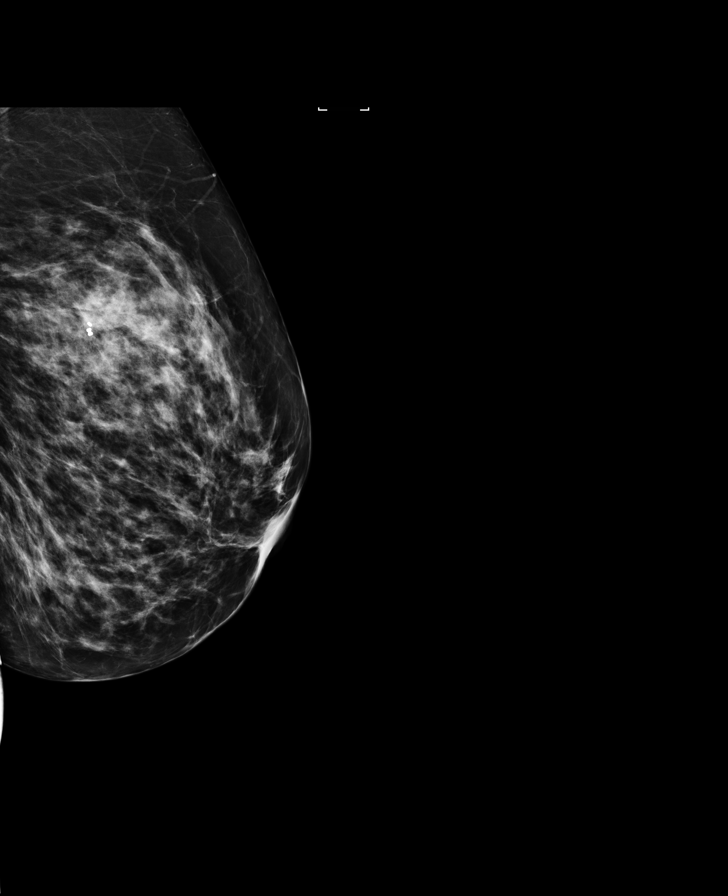

[L LM (2 of 3)]
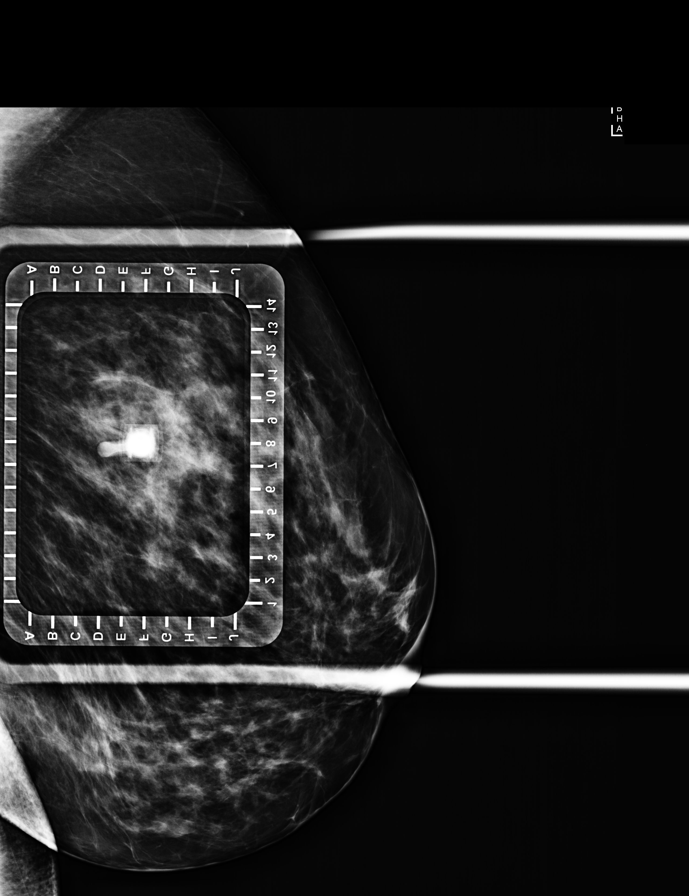

[L LM (3 of 3)]
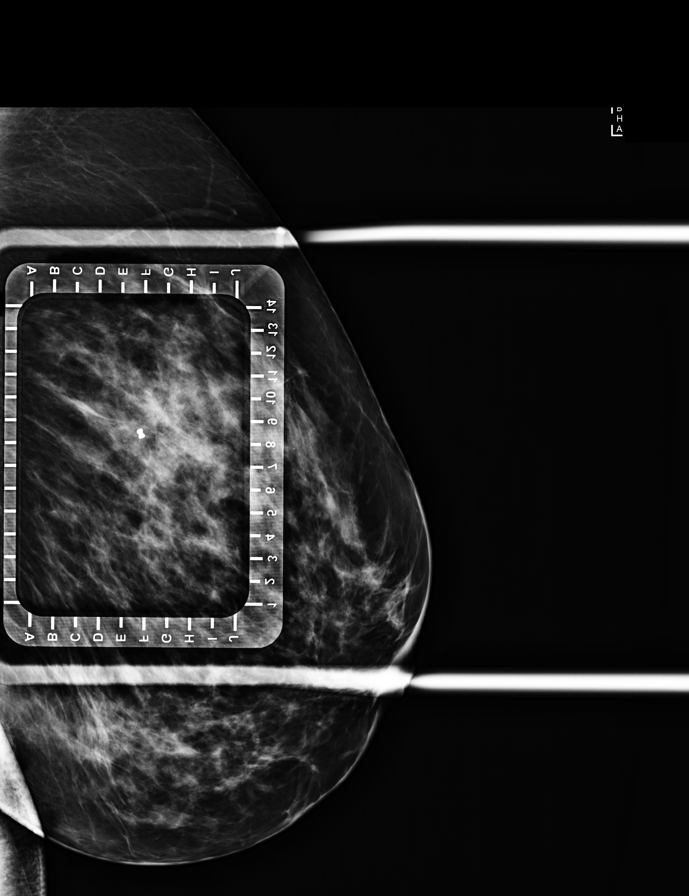

[L CC (3 of 4)]
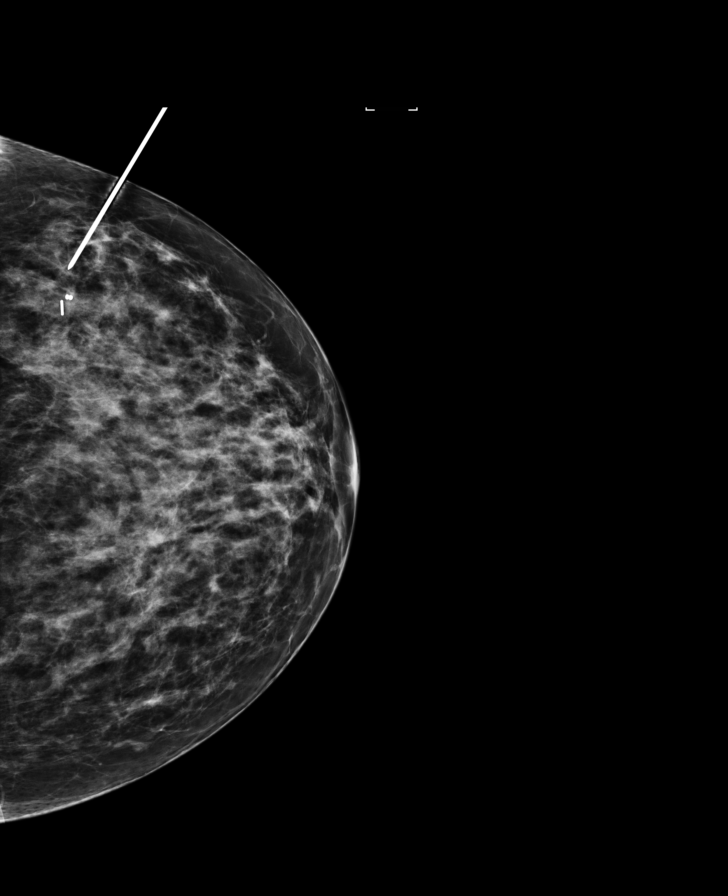

[L CC (4 of 4)]
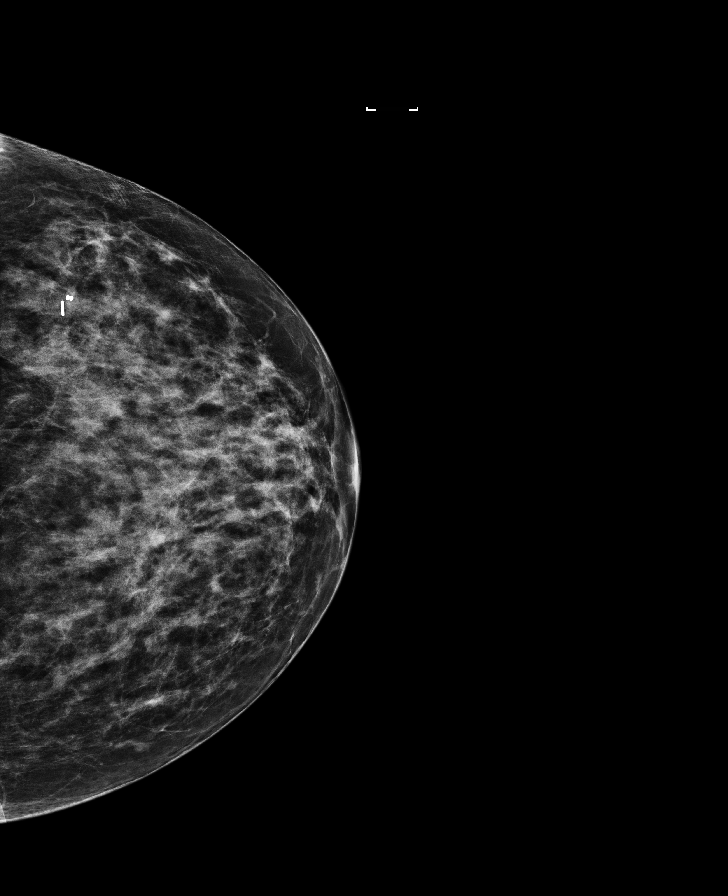

[7 of 7 positions shown; findings below may reference images not displayed]

Also, recent MRI-guided biopsy of the RIGHT breast with pathology
result of sclerosing adenosis, deemed discordant based on imaging
with recommendation for surgical excision. Patient is scheduled for
surgical excision requiring preoperative radioactive seed
localization.

EXAM:
MAMMOGRAPHIC GUIDED RADIOACTIVE SEED LOCALIZATION OF THE BILATERAL
BREAST
FINDINGS: Patient presents for radioactive seed localization prior to . I met
with the patient and we discussed the procedure of seed localization
including benefits and alternatives. We discussed the high
likelihood of a successful procedure. We discussed the risks of the
procedure including infection, bleeding, tissue injury and further
surgery. We discussed the low dose of radioactivity involved in the
procedure. Informed, written consent was given.

The usual time-out protocol was performed immediately prior to the
procedure.

Site 1: Using mammographic guidance, sterile technique, 1% lidocaine
and an O-D3E radioactive seed, the barbell shaped clip within the
upper-outer quadrant of the LEFT breast was localized using a
lateral approach. The follow-up mammogram images confirm the seed in
the expected location and were marked for Dr. Klarika.

Follow-up survey of the patient confirms presence of the radioactive
seed.

Order number of O-D3E seed:  [id number].

Total activity:  0.251 millicuries reference Date: 05/12/2021

Site 2: Using mammographic guidance, sterile technique, 1% lidocaine
and an O-D3E radioactive seed, the cylinder shaped clip within the
upper inner quadrant of the RIGHT breast was localized using a
superior approach. Seed was placed 1 cm medial to the clip as
directed by the postprocedure mammogram report of 04/25/2021. The
follow-up mammogram images confirm the seed in the expected location
and were marked for Dr. Klarika.

Follow-up survey of the patient confirms presence of the radioactive
seed.

Order number of O-D3E seed:  [id number].

Total activity:  0.251 millicuries reference Date: 05/12/2021

The patient tolerated the procedure well and was released from the
[REDACTED]. She was given instructions regarding seed removal.
IMPRESSION: 1. Radioactive seed localization LEFT breast. No apparent
complications.
2. Radioactive seed localization RIGHT breast. No apparent
complications.

## 2022-10-06 IMAGING — MG MM BREAST SURGICAL SPECIMEN
1 series · 2 of 2 positions shown · non-contrast
Comparison: Previous exam(s).

CLINICAL DATA: Status post bilateral surgical excisions today after
earlier radioactive seed localizations.

EXAM:
SPECIMEN RADIOGRAPH OF THE BILATERAL BREAST

[Series 2: R · right · 0.07mm/px · 2 of 2 slices shown]
[im 1/2]
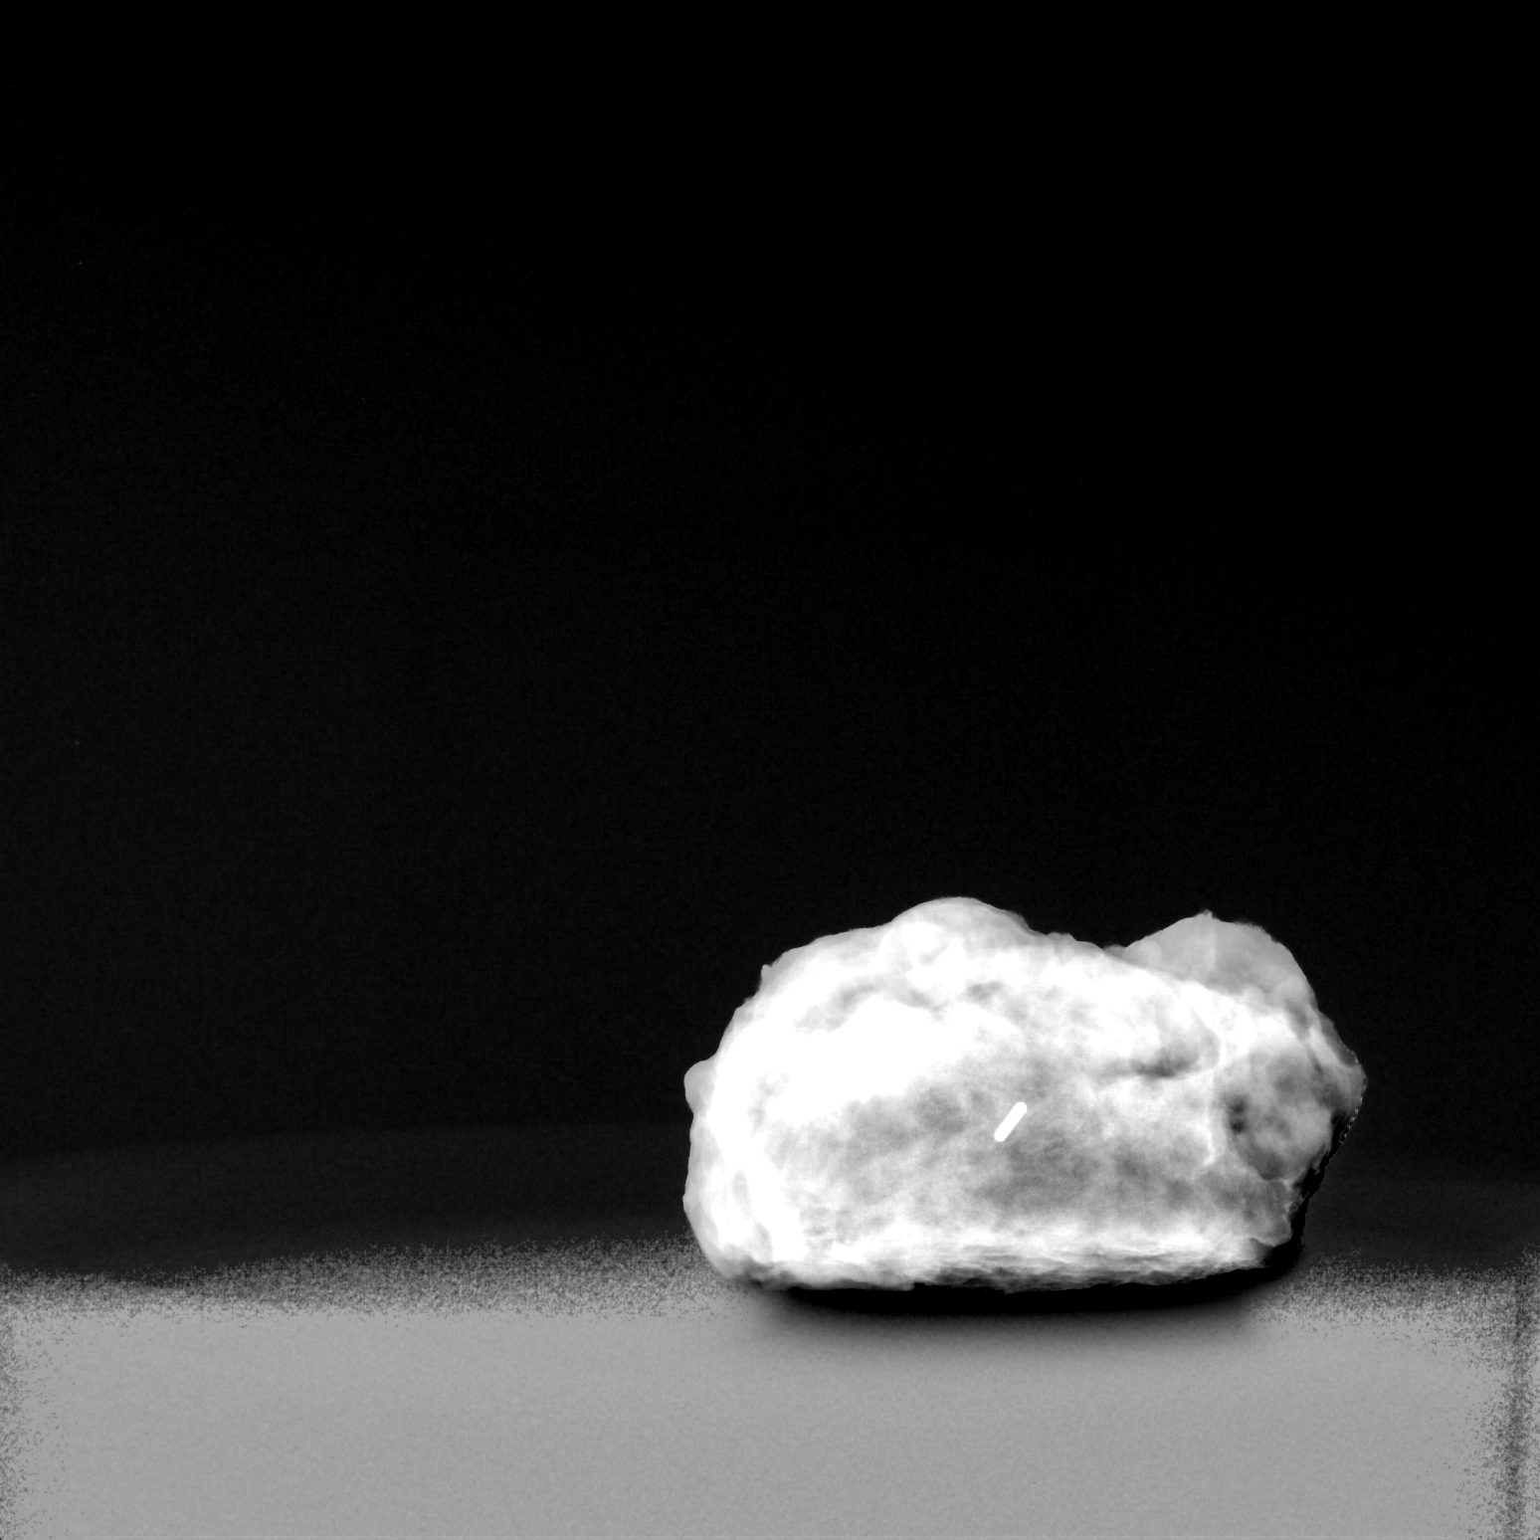
[im 2/2]
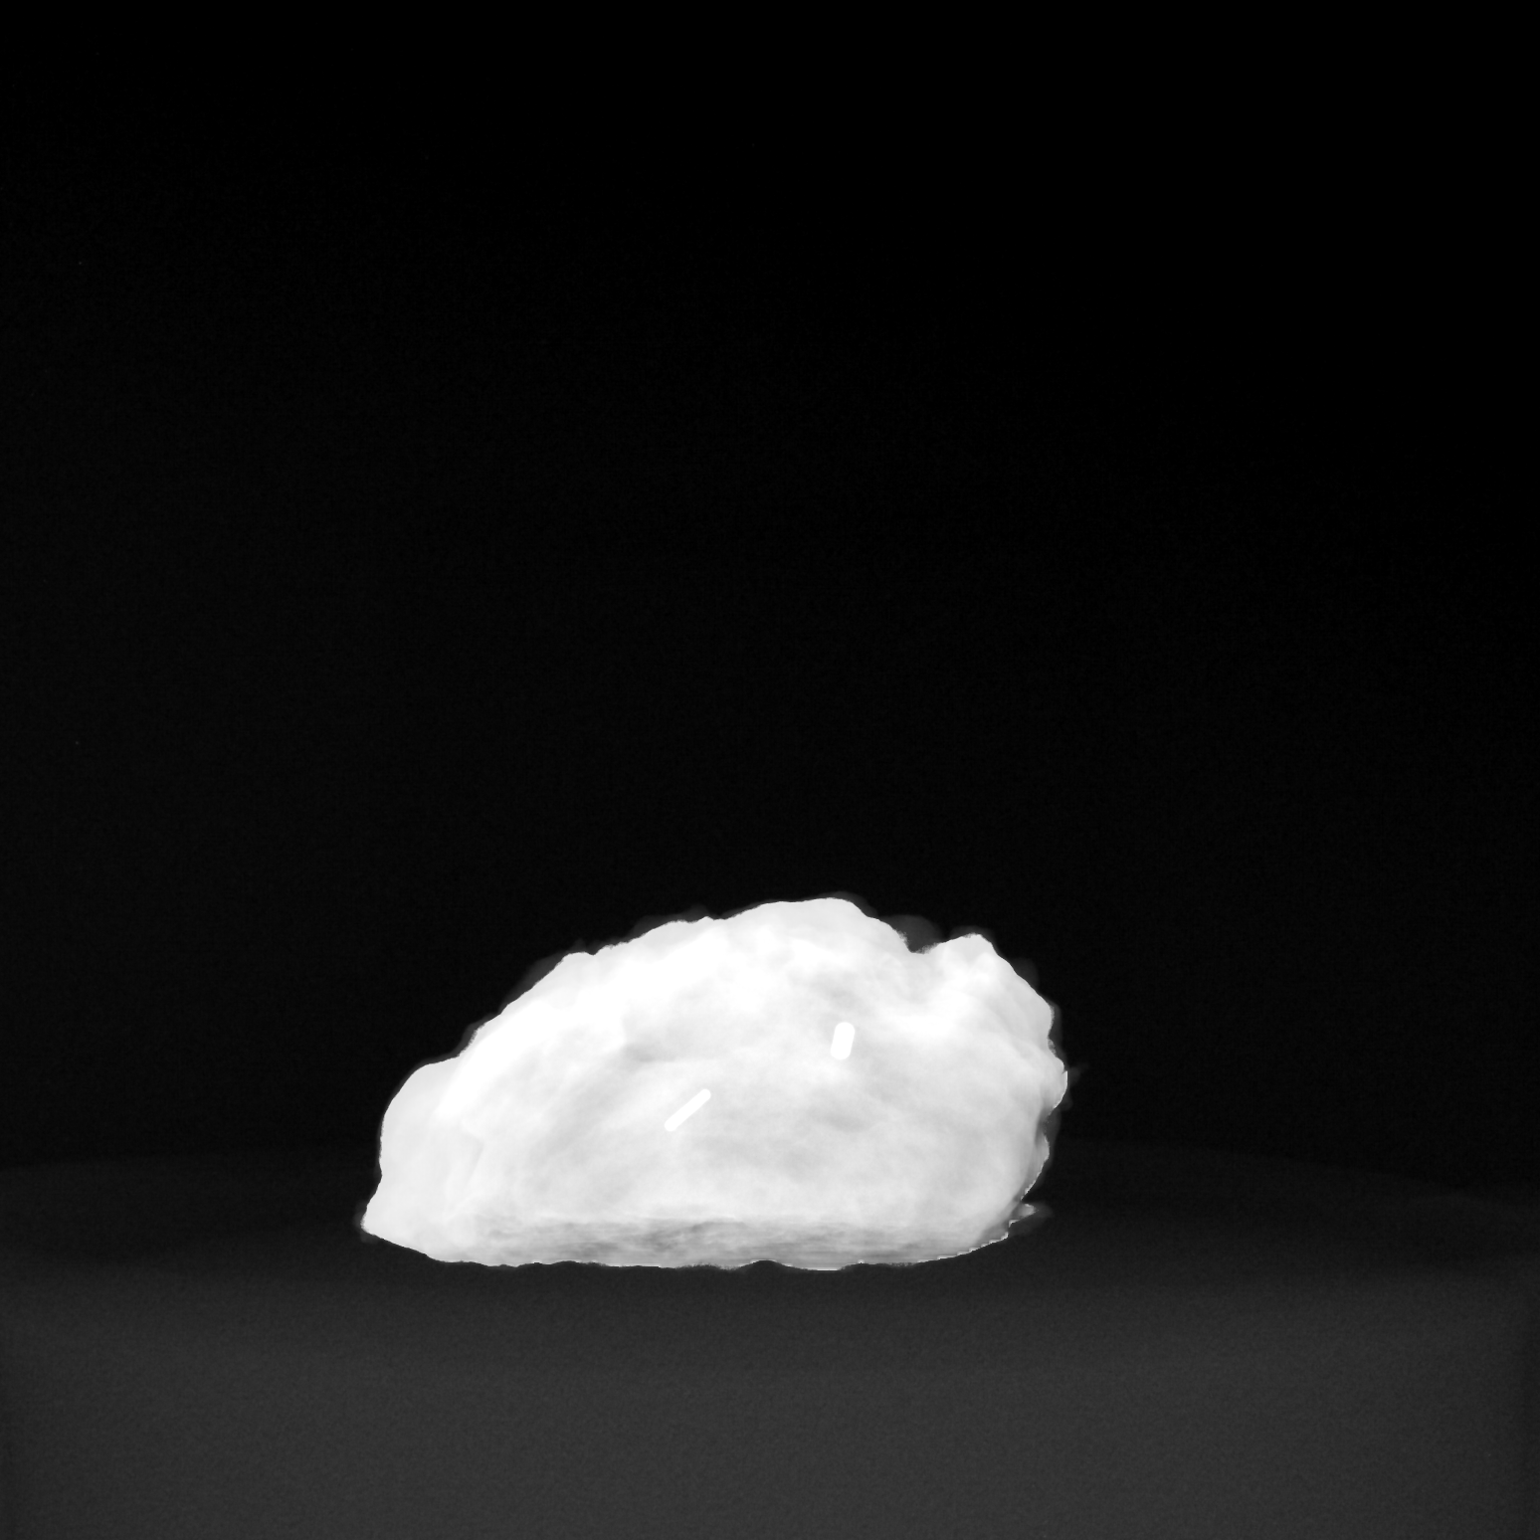

[2 of 2 positions shown; findings below may reference images not displayed]

FINDINGS: Status post excision of the bilateral breast. The radioactive seeds
and biopsy marker clips are present and completely intact within the
specimen. Findings discussed with the OR staff during the procedure.
IMPRESSION: Specimen radiograph of the bilateral breast.

## 2023-02-11 ENCOUNTER — Other Ambulatory Visit: Payer: Self-pay | Admitting: Nurse Practitioner

## 2023-02-11 DIAGNOSIS — Z1231 Encounter for screening mammogram for malignant neoplasm of breast: Secondary | ICD-10-CM

## 2023-03-16 ENCOUNTER — Ambulatory Visit
Admission: RE | Admit: 2023-03-16 | Discharge: 2023-03-16 | Disposition: A | Discharge status: Home / Self Care | Payer: BC Managed Care – PPO | Source: Ambulatory Visit | Attending: Nurse Practitioner | Admitting: Nurse Practitioner

## 2023-03-16 DIAGNOSIS — Z1231 Encounter for screening mammogram for malignant neoplasm of breast: Secondary | ICD-10-CM

## 2024-02-16 ENCOUNTER — Other Ambulatory Visit: Payer: Self-pay | Admitting: Nurse Practitioner

## 2024-02-16 DIAGNOSIS — Z1231 Encounter for screening mammogram for malignant neoplasm of breast: Secondary | ICD-10-CM

## 2024-03-14 NOTE — Progress Notes (Deleted)
 [facility] Initial Consultation   Referring Provider Shari Therisa RAMAN, NP [address] [place],  [place] [address]  Primary Care Provider Shari Therisa RAMAN, NP  Patient Profile: Shari Maldonado is a 53 y.o. female who is seen in consultation in the [facility] at the request of Dr. Pandora for evaluation and management of the problem(s) noted below.  Problem List: GERD History of erosive esophagitis Status post Nissen fundoplication 12/2018, paraesophageal hernia repair and takedown fundoplication 02/2021 Family history of colon polyps-maternal grandfather   History of Present Illness   Shari Maldonado is a 54 y.o. female with a history of ***.  Discussed the use of AI scribe software for clinical note transcription with the patient, who gave verbal consent to proceed.  History of Present Illness     Last colonoscopy: *** Last endoscopy: ***  Last Abd CT/CTE/MRE: ***  GI Review of Symptoms Significant for {GIROS:50592}. Otherwise negative.  General Review of Systems  Review of systems is significant for the pertinent positives and negatives as listed per the HPI.  Full ROS is otherwise negative.  Past Medical History   Past Medical History:  Diagnosis Date   Breast mass    bilateral   GERD (gastroesophageal reflux disease)    High blood pressure    PONV (postoperative nausea and vomiting)      Past Surgical History   Past Surgical History:  Procedure Laterality Date   APPENDECTOMY  1996   BREAST EXCISIONAL BIOPSY Bilateral 05/27/2021   BREAST LUMPECTOMY WITH RADIOACTIVE SEED LOCALIZATION Bilateral 05/27/2021   Procedure: BILATERAL BREAST LUMPECTOMY WITH RADIOACTIVE SEED LOCALIZATION;  Surgeon: Belinda Cough, MD;  Location: [facility];  Service: General;  Laterality: Bilateral;   BREAST REDUCTION SURGERY Bilateral 08/22/2021   Procedure: MAMMARY REDUCTION  (BREAST);  Surgeon: Arelia Filippo, MD;  Location: [facility];  Service: Plastics;  Laterality: Bilateral;   CHOLECYSTECTOMY  2000   PARTIAL HYSTERECTOMY  2012   REDUCTION MAMMAPLASTY Bilateral 08/22/2021     Allergies and Medications   No Known Allergies  @MEDSTODAY @  Family History   Family History  Problem Relation Age of Onset   High blood pressure Mother    Diabetes Mother    High blood pressure Father    Asthma Daughter    Lung cancer Maternal Grandmother    Heart disease Maternal Grandmother    Congestive Heart Failure Maternal Grandmother    COPD Maternal Grandmother    Asthma Maternal Grandmother    Prostate cancer Maternal Grandfather    Lung cancer Paternal Grandmother    Lung cancer Paternal Grandfather    Throat cancer Paternal Grandfather    Asthma Son    Breast cancer Neg Hx      Social History   Social History   Tobacco Use   Smoking status: Never   Smokeless tobacco: Never  Vaping Use   Vaping status: Never Used  Substance Use Topics   Alcohol use: Yes    Comment: occasional   Drug use: No   Shari Maldonado reports that she has never smoked. She has never used smokeless tobacco. She reports current alcohol use. She reports that she does not use drugs.  Vital Signs and Physical Examination   There were no vitals filed for this visit. There is no height or weight on file to calculate BMI.    General: Well developed, well nourished, no acute distress Head: Normocephalic and atraumatic Eyes: Sclerae anicteric, EOMI Ears: Normal auditory acuity Mouth: No  deformities or lesions noted Lungs: Clear throughout to auscultation Heart: Regular rate and rhythm; No murmurs, rubs or bruits Abdomen: Soft, non tender and non distended. No masses, hepatosplenomegaly or hernias noted. Normal Bowel sounds Rectal: Musculoskeletal: Symmetrical with no gross deformities  Pulses:  Normal pulses noted Extremities: No edema or deformities noted Neurological: Alert oriented x 4, grossly nonfocal Psychological:  Alert and  cooperative. Normal mood and affect  Review of Data  The following data was reviewed at the time of this encounter:  Laboratory Studies       No data to display          No results found for: LIPASE    Latest Ref Rng & Units 05/21/2021    4:30 PM  CMP  Glucose 70 - 99 mg/dL 98   BUN 6 - 20 mg/dL 14   Creatinine 9.55 - 1.00 mg/dL 9.14   Sodium 864 - 854 mmol/L 138   Potassium 3.5 - 5.1 mmol/L 3.6   Chloride 98 - 111 mmol/L 105   CO2 22 - 32 mmol/L 25   Calcium 8.9 - 10.3 mg/dL 8.7      Imaging Studies    GI Procedures and Studies  EGD 06/2018 Erosive esophagitis Small hiatal hernia Bravo testing showed positive symptom association with reflux  Bravo 06/2018: Upright 10.7(< 6.3 %) recumbent 2.5(< 1.2 %) total 7.9 (< 4.2 %) Symptom Index: Positive symptom index for heartburn but negative for regurgitation and chest pain Symptom Association: Positive symptom association for heartburn, regurgitation, and chest pain Impression: This is an abnormal Bravo pH test. There is a positive symptom index for the patient's heartburn symptoms but negative for their regurgitation and chest pain symptoms. There is a positive symptom association for the patient's heartburn, regurgitation, and chest pain symptoms with GERD   EGD 12/2017 Salmon-colored mucosa at GE junction with Z-line at 36 cm, gastric folds at 37 cm -no intestinal metaplasia on biopsies  EGD 04/2017 Results unavailable Negative esophageal, gastric and duodenal biopsies-no Barrett's epithelium   Clinical Impression  It is my clinical impression that Shari Maldonado is a 53 y.o. female with;  ***  Plan  *** *** *** *** ***  Planned Follow Up No follow-ups on file.  The patient or caregiver verbalized understanding of the material covered, with no barriers to understanding. All questions were answered. Patient or caregiver is agreeable with the plan outlined above.    It was a pleasure to see Shari Maldonado.  If you  have any questions or concerns regarding this evaluation, do not hesitate to contact me.  Inocente Hausen, MD [facility]

## 2024-03-15 ENCOUNTER — Ambulatory Visit: Admitting: Pediatrics

## 2024-03-16 ENCOUNTER — Ambulatory Visit
Admission: RE | Admit: 2024-03-16 | Discharge: 2024-03-16 | Disposition: A | Source: Ambulatory Visit | Attending: Nurse Practitioner | Admitting: Nurse Practitioner

## 2024-03-16 DIAGNOSIS — Z1231 Encounter for screening mammogram for malignant neoplasm of breast: Secondary | ICD-10-CM
